# Patient Record
Sex: Female | Born: 1985
Health system: Southern US, Community
[De-identification: ages and names within clinical notes are randomized; demographics above are authoritative.]

## PROBLEM LIST (undated history)

## (undated) DIAGNOSIS — N189 Chronic kidney disease, unspecified: Secondary | ICD-10-CM

## (undated) HISTORY — PX: NO PAST SURGERIES: SHX2092

## (undated) HISTORY — PX: DENTAL SURGERY: SHX609

## (undated) HISTORY — DX: Chronic kidney disease, unspecified: N18.9

---
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
## 2009-07-11 ENCOUNTER — Ambulatory Visit: Payer: Self-pay | Admitting: Diagnostic Radiology

## 2009-07-11 ENCOUNTER — Emergency Department (HOSPITAL_BASED_OUTPATIENT_CLINIC_OR_DEPARTMENT_OTHER): Admission: EM | Admit: 2009-07-11 | Discharge: 2009-07-11 | Payer: Self-pay | Admitting: Emergency Medicine

## 2010-03-26 IMAGING — CT CT ABDOMEN W/O CM
2 of 4 series · 16 of 46 positions shown, 18 images · non-contrast
Comparison: None available.

CT ABDOMEN

CLINICAL DATA: Left flank pain.

CT ABDOMEN AND PELVIS WITHOUT CONTRAST
TECHNIQUE: Multidetector CT imaging of the abdomen and pelvis was
performed following the standard protocol without intravenous
contrast.

[Series 2: renal stone < 200 lbs 5.0 b31f · axial · 0.65mm/px · z∈[-680,-305]mm · 13 of 83 slices shown, 15 images]
[im 4/83  soft-tissue]
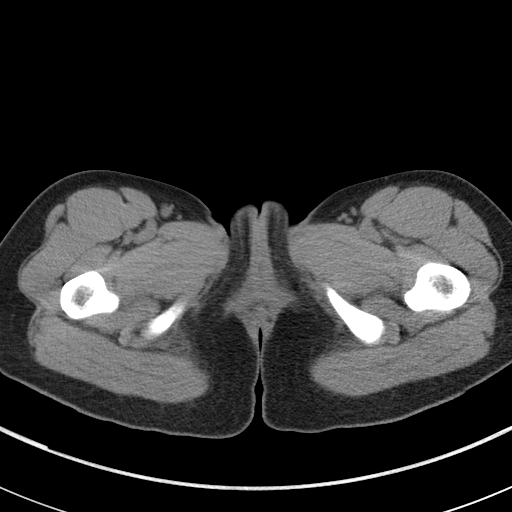
[im 4/83  bone]
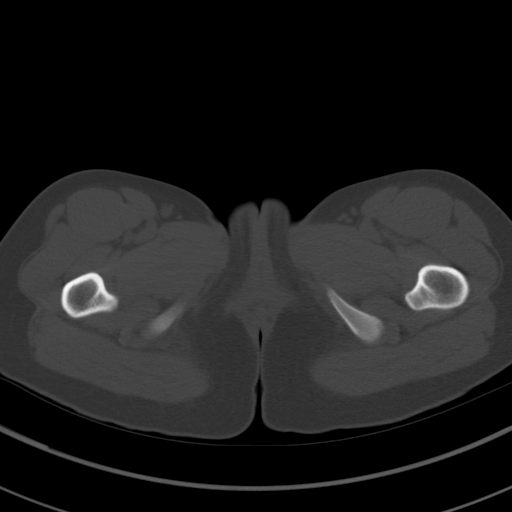
[im 10/83  soft-tissue]
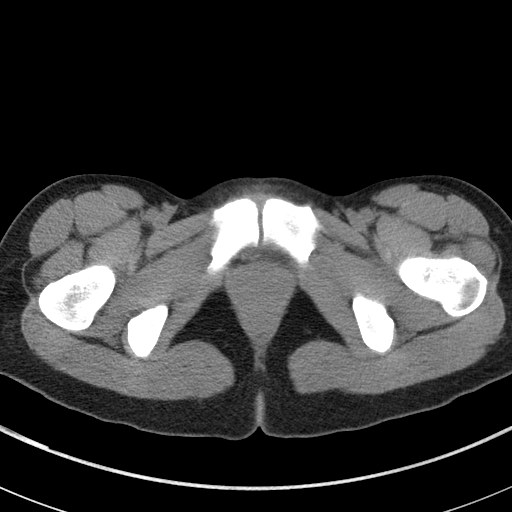
[im 17/83  soft-tissue]
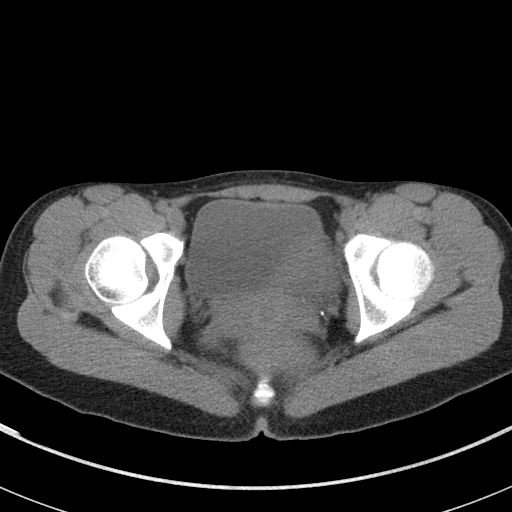
[im 23/83  soft-tissue]
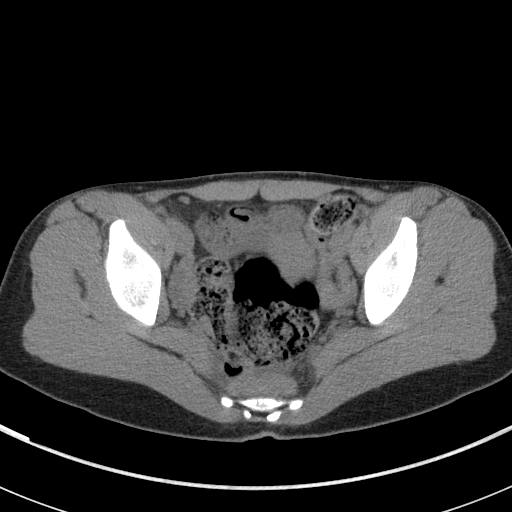
[im 30/83  soft-tissue]
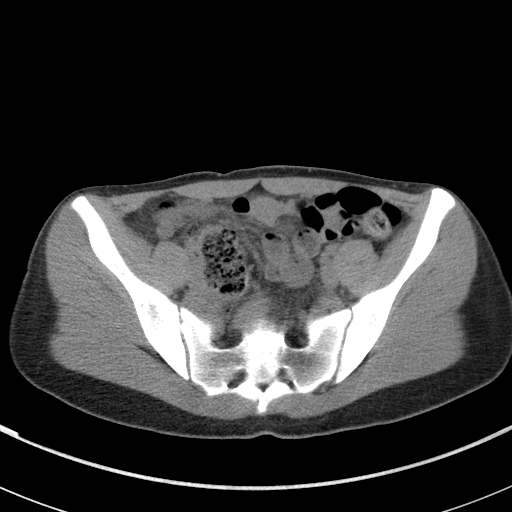
[im 37/83  soft-tissue]
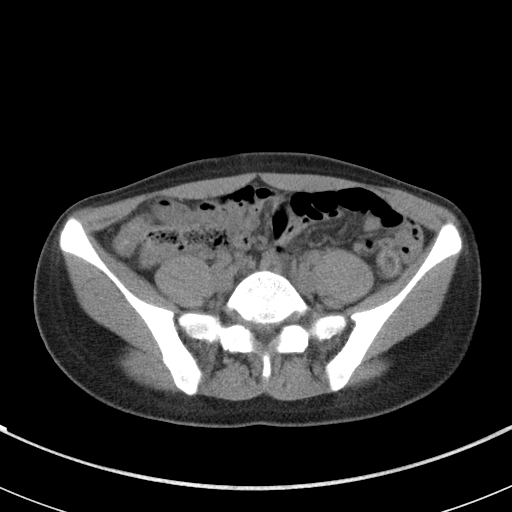
[im 43/83  soft-tissue]
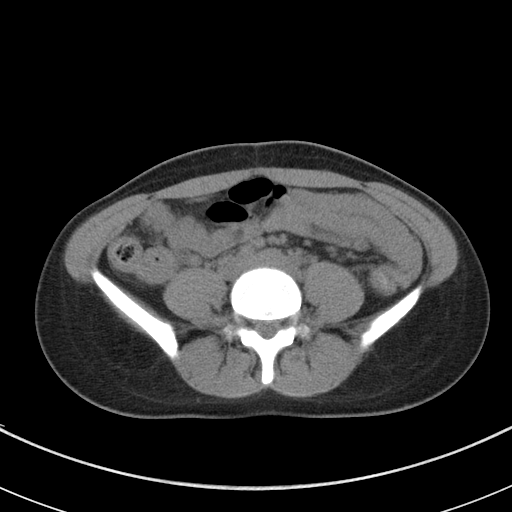
[im 46/83  soft-tissue]
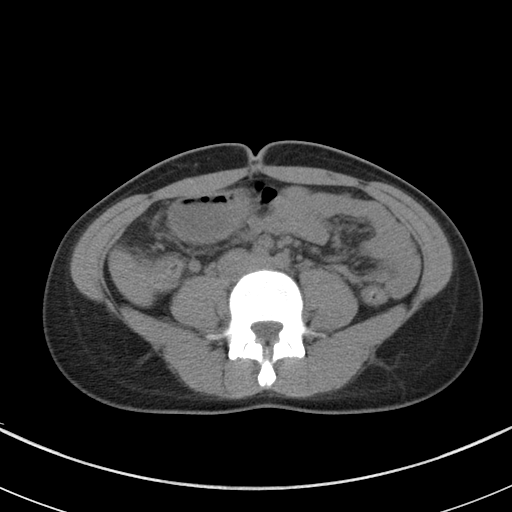
[im 53/83  soft-tissue]
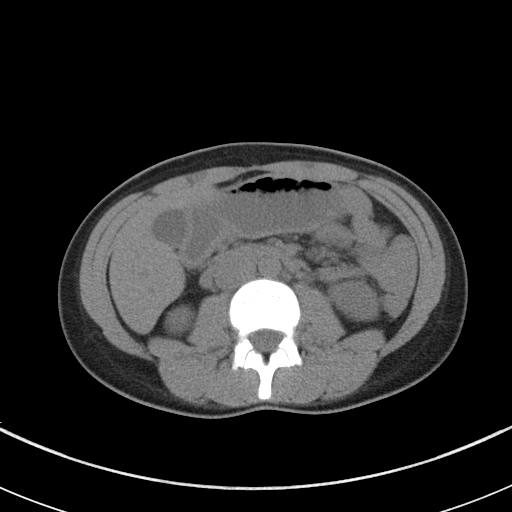
[im 53/83  bone]
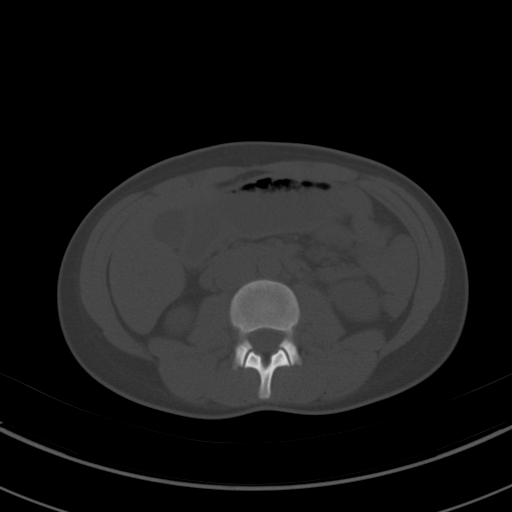
[im 60/83  soft-tissue]
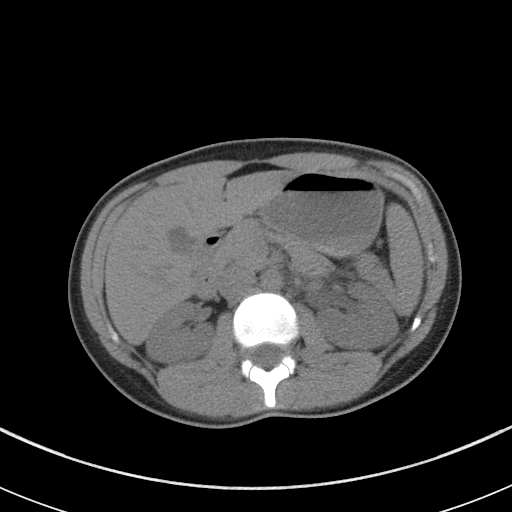
[im 66/83  soft-tissue]
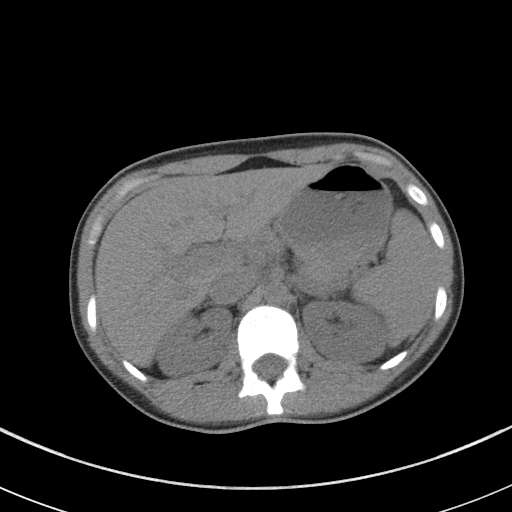
[im 73/83  soft-tissue]
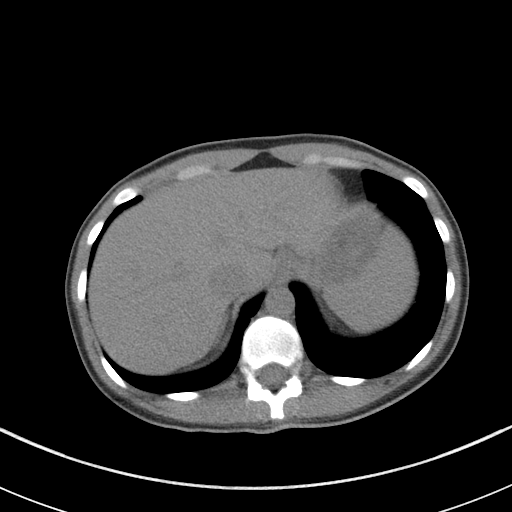
[im 79/83  soft-tissue]
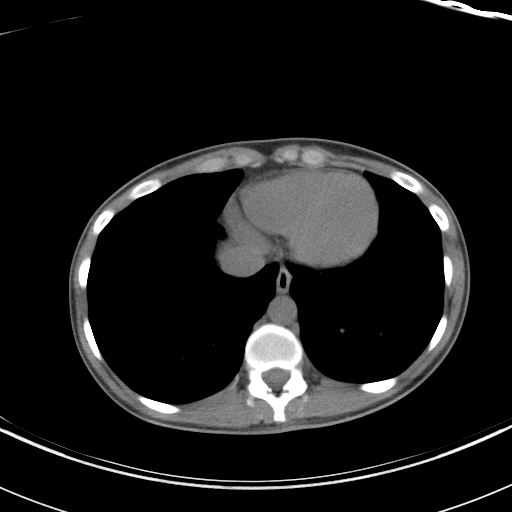

[Series 5: renal stone 3.0 coronal · coronal · 0.65mm/px · 3 of 63 slices shown]
[im 21/63  soft-tissue]
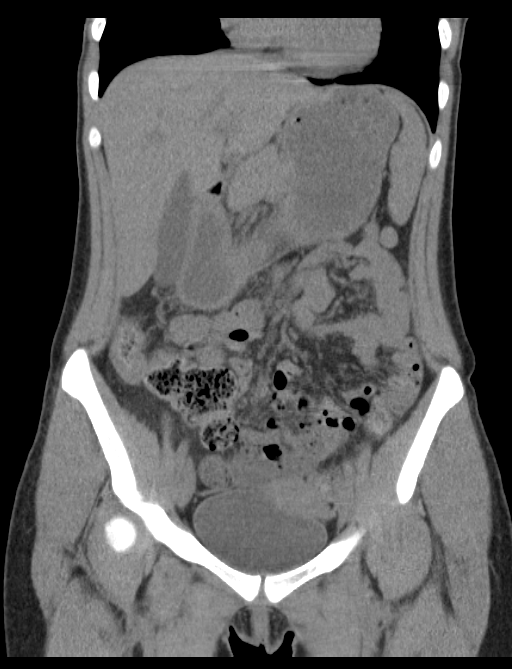
[im 28/63  soft-tissue]
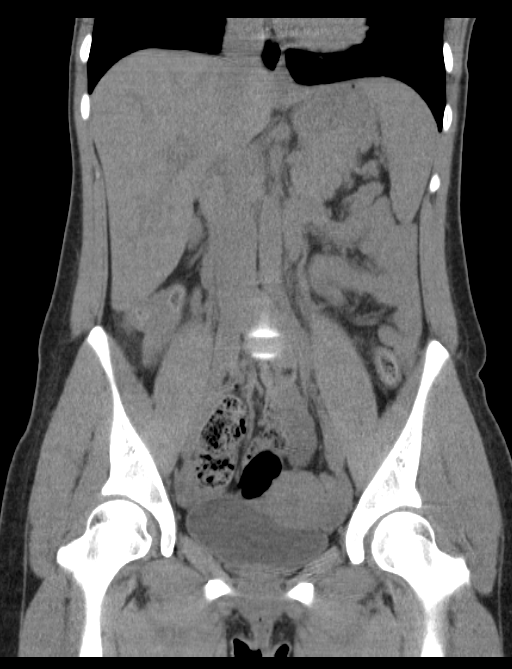
[im 35/63  soft-tissue]
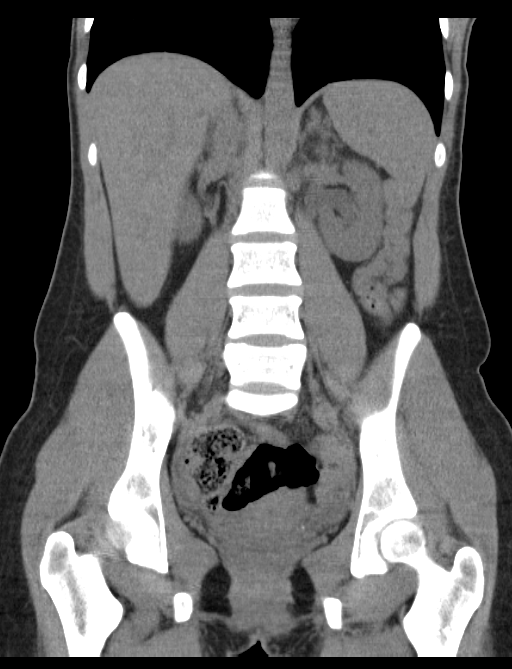

[16 of 46 positions shown; findings below may reference images not displayed]

FINDINGS: The lung bases are clear.  There is no pleural or
pericardial effusion.

The patient has mild to moderate left hydronephrosis.  The ureter
cannot be traced in its entirety due to lack of intra-abdominal
fat.  No definite left ureteral stone is identified although two
punctate calcifications in left hemi pelvis measuring 2 mm or less
could lie within the left ureter.  There are no renal stones on the
right or left.  The liver, gallbladder, spleen, pancreas and
adrenal glands appear normal.  Stomach small bowel normal.  No
abdominal lymphadenopathy or fluid.  No focal bony abnormality.
IMPRESSION: Mild to moderate left hydronephrosis.  As described above, the left
ureter cannot be traced in its entirety.  Two punctate
calcifications in the left hemi pelvis measuring 2 mm or less may
lie within the left ureter can be phleboliths.  It is also possible
that the patient has already passed a stone  and has residual left
hydronephrosis.  Abdomen is otherwise negative.

CT PELVIS
FINDINGS: Uterus, adnexa and urinary bladder appear normal.  Tiny
amount of free pelvic fluid is compatible physiologic change.
Colon and appendix appear normal.  No focal bony abnormality.
IMPRESSION: Possible distal left ureteral stone is again noted.  Pelvis is
otherwise negative.

## 2010-12-18 LAB — DIFFERENTIAL
Basophils Absolute: 0.1 10*3/uL (ref 0.0–0.1)
Basophils Relative: 0 % (ref 0–1)
Eosinophils Absolute: 0 10*3/uL (ref 0.0–0.7)
Monocytes Absolute: 0.6 10*3/uL (ref 0.1–1.0)
Neutro Abs: 11.4 10*3/uL — ABNORMAL HIGH (ref 1.7–7.7)
Neutrophils Relative %: 85 % — ABNORMAL HIGH (ref 43–77)

## 2010-12-18 LAB — CBC
HCT: 40.7 % (ref 36.0–46.0)
Hemoglobin: 14.2 g/dL (ref 12.0–15.0)
MCHC: 34.9 g/dL (ref 30.0–36.0)
MCV: 84.9 fL (ref 78.0–100.0)
Platelets: 190 10*3/uL (ref 150–400)
RBC: 4.8 MIL/uL (ref 3.87–5.11)
RDW: 12.3 % (ref 11.5–15.5)
WBC: 13.6 10*3/uL — ABNORMAL HIGH (ref 4.0–10.5)

## 2010-12-18 LAB — URINALYSIS, ROUTINE W REFLEX MICROSCOPIC
Ketones, ur: 15 mg/dL — AB
Protein, ur: NEGATIVE mg/dL
Urobilinogen, UA: 0.2 mg/dL (ref 0.0–1.0)

## 2010-12-18 LAB — COMPREHENSIVE METABOLIC PANEL
ALT: 12 U/L (ref 0–35)
AST: 25 U/L (ref 0–37)
Albumin: 4.6 g/dL (ref 3.5–5.2)
Alkaline Phosphatase: 63 U/L (ref 39–117)
BUN: 15 mg/dL (ref 6–23)
CO2: 26 mEq/L (ref 19–32)
Calcium: 9.8 mg/dL (ref 8.4–10.5)
Chloride: 104 mEq/L (ref 96–112)
Creatinine, Ser: 0.9 mg/dL (ref 0.4–1.2)
GFR calc Af Amer: >60 mL/min (ref >60)
GFR calc non Af Amer: >60 mL/min (ref >60)
Glucose, Bld: 144 mg/dL — ABNORMAL HIGH (ref 70–99)
Potassium: 4 mEq/L (ref 3.5–5.1)
Sodium: 142 mEq/L (ref 135–145)
Total Bilirubin: 0.8 mg/dL (ref 0.3–1.2)
Total Protein: 7.6 g/dL (ref 6.0–8.3)

## 2010-12-18 LAB — URINE MICROSCOPIC-ADD ON

## 2016-04-20 ENCOUNTER — Ambulatory Visit (INDEPENDENT_AMBULATORY_CARE_PROVIDER_SITE_OTHER): Payer: BLUE CROSS/BLUE SHIELD | Admitting: Certified Nurse Midwife

## 2016-04-20 ENCOUNTER — Encounter: Payer: Self-pay | Admitting: Certified Nurse Midwife

## 2016-04-20 ENCOUNTER — Encounter (INDEPENDENT_AMBULATORY_CARE_PROVIDER_SITE_OTHER): Payer: Self-pay

## 2016-04-20 VITALS — BP 129/70 | HR 69 | Ht 66.0 in | Wt 127.0 lb

## 2016-04-20 DIAGNOSIS — Z3402 Encounter for supervision of normal first pregnancy, second trimester: Secondary | ICD-10-CM

## 2016-04-20 DIAGNOSIS — Z36 Encounter for antenatal screening of mother: Secondary | ICD-10-CM | POA: Diagnosis not present

## 2016-04-20 DIAGNOSIS — Z113 Encounter for screening for infections with a predominantly sexual mode of transmission: Secondary | ICD-10-CM | POA: Diagnosis not present

## 2016-04-20 DIAGNOSIS — Z3492 Encounter for supervision of normal pregnancy, unspecified, second trimester: Secondary | ICD-10-CM

## 2016-04-20 DIAGNOSIS — Z3400 Encounter for supervision of normal first pregnancy, unspecified trimester: Secondary | ICD-10-CM | POA: Insufficient documentation

## 2016-04-20 NOTE — Progress Notes (Signed)
[name] PATIENT: [x ] initial, [x ] 12, [ ]  20, [ ]  28, [ ]  32, [ ]  36, [ ]  38, [ ]  39, [ ]  40   Bedside U/S today showas IUP with FHT of 162  GA measures 4080w6d

## 2016-04-20 NOTE — Progress Notes (Signed)
  Subjective:    Brittany Welch is a G1P0 3566w6d being seen today for her first obstetrical visit.  Her obstetrical history is significant for none. Patient does intend to breast feed. Pregnancy history fully reviewed.  Patient reports nausea and vomiting, occasional 1-3 times per week.  Vitals:   04/20/16 0817 04/20/16 0822  BP: 129/70   Pulse: 69   Weight: 127 lb (57.6 kg)   Height:  5\' 6"  (1.676 m)    HISTORY: OB History  Gravida Para Term Preterm AB Living  1            SAB TAB Ectopic Multiple Live Births               # Outcome Date GA Lbr Len/2nd Weight Sex Delivery Anes PTL Lv  1 Current              Past Medical History:  Diagnosis Date  . Chronic kidney disease    nephrolithiasis   Past Surgical History:  Procedure Laterality Date  . DENTAL SURGERY     History reviewed. No pertinent family history.   Exam    Uterus:     Pelvic Exam:    Perineum: deferred   Vulva:    Vagina:     pH:    Cervix:    Adnexa:    Bony Pelvis:   System: Breast:  normal appearance, no masses or tenderness, Inspection negative, No nipple retraction or dimpling, No nipple discharge or bleeding, No axillary or supraclavicular adenopathy, Normal to palpation without dominant masses, Taught monthly breast self examination   Skin: normal coloration and turgor, no rashes    Neurologic: oriented, normal   Extremities: normal strength, tone, and muscle mass, no deformities, ROM of all joints is normal   HEENT PERRLA   Mouth/Teeth mucous membranes moist, pharynx normal without lesions   Neck supple and no masses   Cardiovascular: regular rate and rhythm, no murmurs or gallops   Respiratory:  appears well, vitals normal, no respiratory distress, acyanotic, normal RR, ear and throat exam is normal, neck free of mass or lymphadenopathy, chest clear, no wheezing, crepitations, rhonchi, normal symmetric air entry   Abdomen: Soft, non-tender   Urinary: deferred     Assessment:    Pregnancy: G1P0 Patient Active Problem List   Diagnosis Date Noted  . Supervision of normal first pregnancy 04/20/2016     Plan:     Initial labs drawn. Prenatal vitamins. Problem list reviewed and updated. Ultrasound discussed; fetal survey: requested. Follow up in 6 weeks. Baby Scripts Declined pelvic and pap today, discussed recommendations for cervical cancer screening  Donette LarryMelanie Cameron Schwinn, CNM 04/20/2016

## 2016-04-21 LAB — OBSTETRIC PANEL
ANTIBODY SCREEN: NEGATIVE
BASOS ABS: 0 {cells}/uL (ref 0–200)
Basophils Relative: 0 %
EOS PCT: 0 %
Eosinophils Absolute: 0 cells/uL — ABNORMAL LOW (ref 15–500)
HEMATOCRIT: 37.3 % (ref 35.0–45.0)
HEMOGLOBIN: 12.5 g/dL (ref 11.7–15.5)
Hepatitis B Surface Ag: NEGATIVE
LYMPHS PCT: 19 %
Lymphs Abs: 1691 cells/uL (ref 850–3900)
MCH: 27.8 pg (ref 27.0–33.0)
MCHC: 33.5 g/dL (ref 32.0–36.0)
MCV: 82.9 fL (ref 80.0–100.0)
MONOS PCT: 5 %
MPV: 11 fL (ref 7.5–12.5)
Monocytes Absolute: 445 cells/uL (ref 200–950)
NEUTROS ABS: 6764 {cells}/uL (ref 1500–7800)
Neutrophils Relative %: 76 %
Platelets: 210 10*3/uL (ref 140–400)
RBC: 4.5 MIL/uL (ref 3.80–5.10)
RDW: 14.1 % (ref 11.0–15.0)
Rh Type: POSITIVE
Rubella: <0.9 Index (ref <0.90)
WBC: 8.9 10*3/uL (ref 3.8–10.8)

## 2016-04-21 LAB — GC/CHLAMYDIA PROBE AMP
CT PROBE, AMP APTIMA: NOT DETECTED
GC PROBE AMP APTIMA: NOT DETECTED

## 2016-04-27 ENCOUNTER — Encounter: Payer: Self-pay | Admitting: Certified Nurse Midwife

## 2016-05-19 ENCOUNTER — Encounter (HOSPITAL_COMMUNITY): Payer: Self-pay | Admitting: Certified Nurse Midwife

## 2016-05-27 ENCOUNTER — Ambulatory Visit (HOSPITAL_COMMUNITY)
Admission: RE | Admit: 2016-05-27 | Discharge: 2016-05-27 | Disposition: A | Discharge status: Home / Self Care | Payer: BLUE CROSS/BLUE SHIELD | Source: Ambulatory Visit | Attending: Certified Nurse Midwife | Admitting: Certified Nurse Midwife

## 2016-05-27 ENCOUNTER — Other Ambulatory Visit: Payer: Self-pay | Admitting: Certified Nurse Midwife

## 2016-05-27 DIAGNOSIS — Z1389 Encounter for screening for other disorder: Secondary | ICD-10-CM

## 2016-05-27 DIAGNOSIS — Z3402 Encounter for supervision of normal first pregnancy, second trimester: Secondary | ICD-10-CM

## 2016-05-27 DIAGNOSIS — Z3A19 19 weeks gestation of pregnancy: Secondary | ICD-10-CM | POA: Insufficient documentation

## 2016-05-27 DIAGNOSIS — Z3492 Encounter for supervision of normal pregnancy, unspecified, second trimester: Secondary | ICD-10-CM

## 2016-05-27 DIAGNOSIS — Z36 Encounter for antenatal screening of mother: Secondary | ICD-10-CM | POA: Insufficient documentation

## 2016-05-27 DIAGNOSIS — Z363 Encounter for antenatal screening for malformations: Secondary | ICD-10-CM

## 2016-06-01 ENCOUNTER — Ambulatory Visit (INDEPENDENT_AMBULATORY_CARE_PROVIDER_SITE_OTHER): Payer: BLUE CROSS/BLUE SHIELD | Admitting: Certified Nurse Midwife

## 2016-06-01 DIAGNOSIS — Z3402 Encounter for supervision of normal first pregnancy, second trimester: Secondary | ICD-10-CM

## 2016-06-01 NOTE — Progress Notes (Signed)
Subjective:  Brittany GatesKristen D Bethea is a 30 y.o. G1P0 at 282w6d being seen today for ongoing prenatal care.  She is currently monitored for the following issues for this low-risk pregnancy and has Supervision of normal first pregnancy on her problem list.  Patient reports no complaints.  Contractions: Not present. Vag. Bleeding: None.  Movement: Absent. Denies leaking of fluid.   The following portions of the patient's history were reviewed and updated as appropriate: allergies, current medications, past family history, past medical history, past social history, past surgical history and problem list. Problem list updated.  Objective:   Vitals:   06/01/16 0854  BP: 122/65  Pulse: 87  Weight: 134 lb (60.8 kg)    Fetal Status: Fetal Heart Rate (bpm): 147 Fundal Height: 20 cm Movement: Absent     General:  Alert, oriented and cooperative. Patient is in no acute distress.  Skin: Skin is warm and dry. No rash noted.   Cardiovascular: Normal heart rate noted  Respiratory: Normal respiratory effort, no problems with respiration noted  Abdomen: Soft, gravid, appropriate for gestational age. Pain/Pressure: Absent     Pelvic: Vag. Bleeding: None Vag D/C Character: Thin   Cervical exam deferred        Extremities: Normal range of motion.  Edema: None  Mental Status: Normal mood and affect. Normal behavior. Normal judgment and thought content.   Urinalysis: Urine Protein: Negative Urine Glucose: Negative  Assessment and Plan:  Pregnancy: G1P0 at 192w6d  1. Encounter for supervision of normal first pregnancy in second trimester - Second trimester anticipatory guidance  Preterm labor symptoms and general obstetric precautions including but not limited to vaginal bleeding, contractions, leaking of fluid and fetal movement were reviewed in detail with the patient. Please refer to After Visit Summary for other counseling recommendations.  Return in about 8 weeks (around 07/27/2016).   Donette LarryMelanie  Journe Hallmark, CNM

## 2016-06-02 LAB — HIV ANTIBODY (ROUTINE TESTING W REFLEX): HIV 1&2 Ab, 4th Generation: NONREACTIVE

## 2016-07-27 ENCOUNTER — Encounter: Payer: BLUE CROSS/BLUE SHIELD | Admitting: Obstetrics & Gynecology

## 2016-07-28 ENCOUNTER — Ambulatory Visit (INDEPENDENT_AMBULATORY_CARE_PROVIDER_SITE_OTHER): Payer: BLUE CROSS/BLUE SHIELD | Admitting: Obstetrics & Gynecology

## 2016-07-28 VITALS — BP 120/63 | HR 108 | Wt 143.0 lb

## 2016-07-28 DIAGNOSIS — Z3492 Encounter for supervision of normal pregnancy, unspecified, second trimester: Secondary | ICD-10-CM | POA: Diagnosis not present

## 2016-07-28 DIAGNOSIS — Z3402 Encounter for supervision of normal first pregnancy, second trimester: Secondary | ICD-10-CM

## 2016-07-28 NOTE — Progress Notes (Signed)
   PRENATAL VISIT NOTE  Subjective:  Brittany GatesKristen D Welch is a 30 y.o. G1P0 at 3852w0d being seen today for ongoing prenatal care.  She is currently monitored for the following issues for this low-risk pregnancy and has Supervision of normal first pregnancy on her problem list.  Patient reports no complaints.  Contractions: Not present. Vag. Bleeding: None.  Movement: Present. Denies leaking of fluid.   The following portions of the patient's history were reviewed and updated as appropriate: allergies, current medications, past family history, past medical history, past social history, past surgical history and problem list. Problem list updated.  Objective:   Vitals:   07/28/16 1518  BP: 120/63  Pulse: (!) 108  Weight: 143 lb (64.9 kg)    Fetal Status: Fetal Heart Rate (bpm): 143   Movement: Present     General:  Alert, oriented and cooperative. Patient is in no acute distress.  Skin: Skin is warm and dry. No rash noted.   Cardiovascular: Normal heart rate noted  Respiratory: Normal respiratory effort, no problems with respiration noted  Abdomen: Soft, gravid, appropriate for gestational age. Pain/Pressure: Absent     Pelvic:  Cervical exam deferred        Extremities: Normal range of motion.  Edema: None  Mental Status: Normal mood and affect. Normal behavior. Normal judgment and thought content.   Assessment and Plan:  Pregnancy: G1P0 at 6152w0d  1. Normal pregnancy in second trimester  - Glucose Tolerance, 1 HR (50g) - CBC - HIV antibody (with reflex) - RPR  2. Encounter for supervision of normal first pregnancy in second trimester   Preterm labor symptoms and general obstetric precautions including but not limited to vaginal bleeding, contractions, leaking of fluid and fetal movement were reviewed in detail with the patient. Please refer to After Visit Summary for other counseling recommendations.  No Follow-up on file.   Allie BossierMyra C Lanny Donoso, MD

## 2016-07-29 ENCOUNTER — Telehealth: Payer: Self-pay

## 2016-07-29 LAB — CBC
HCT: 32.6 % — ABNORMAL LOW (ref 35.0–45.0)
Hemoglobin: 11 g/dL — ABNORMAL LOW (ref 11.7–15.5)
MCH: 28.9 pg (ref 27.0–33.0)
MCHC: 33.7 g/dL (ref 32.0–36.0)
MCV: 85.6 fL (ref 80.0–100.0)
MPV: 11.6 fL (ref 7.5–12.5)
PLATELETS: 202 10*3/uL (ref 140–400)
RBC: 3.81 MIL/uL (ref 3.80–5.10)
RDW: 14.3 % (ref 11.0–15.0)
WBC: 9.8 10*3/uL (ref 3.8–10.8)

## 2016-07-29 LAB — RPR

## 2016-07-29 LAB — GLUCOSE TOLERANCE, 1 HOUR (50G) W/O FASTING: Glucose, 1 Hr, gestational: 121 mg/dL (ref <140)

## 2016-07-29 LAB — HIV ANTIBODY (ROUTINE TESTING W REFLEX): HIV: NONREACTIVE

## 2016-07-29 NOTE — Telephone Encounter (Signed)
Spoke with pt she is aware that she passed GTT

## 2016-08-25 ENCOUNTER — Ambulatory Visit (INDEPENDENT_AMBULATORY_CARE_PROVIDER_SITE_OTHER): Payer: BLUE CROSS/BLUE SHIELD | Admitting: Obstetrics & Gynecology

## 2016-08-25 VITALS — BP 110/54 | HR 96 | Wt 147.0 lb

## 2016-08-25 DIAGNOSIS — Z3402 Encounter for supervision of normal first pregnancy, second trimester: Secondary | ICD-10-CM

## 2016-08-25 NOTE — Progress Notes (Signed)
   PRENATAL VISIT NOTE  Subjective:  Brittany Welch is a 30 y.o. MW G1P0 (son to be named Lucillie Garfinkelarter Matthew)  at 2539w0d being seen today for ongoing prenatal care.  She is currently monitored for the following issues for this low-risk pregnancy and has Supervision of normal first pregnancy on her problem list.  Patient reports no complaints.  Contractions: Not present. Vag. Bleeding: None.  Movement: Present. Denies leaking of fluid.   The following portions of the patient's history were reviewed and updated as appropriate: allergies, current medications, past family history, past medical history, past social history, past surgical history and problem list. Problem list updated.  Objective:   Vitals:   08/25/16 1451  BP: (!) 110/54  Pulse: 96  Weight: 147 lb (66.7 kg)    Fetal Status: Fetal Heart Rate (bpm): 152   Movement: Present     General:  Alert, oriented and cooperative. Patient is in no acute distress.  Skin: Skin is warm and dry. No rash noted.   Cardiovascular: Normal heart rate noted  Respiratory: Normal respiratory effort, no problems with respiration noted  Abdomen: Soft, gravid, appropriate for gestational age. Pain/Pressure: Absent     Pelvic:  Cervical exam deferred        Extremities: Normal range of motion.  Edema: Trace  Mental Status: Normal mood and affect. Normal behavior. Normal judgment and thought content.   Assessment and Plan:  Pregnancy: G1P0 at 4939w0d  1. Encounter for supervision of normal first pregnancy in second trimester - Cervical cultures at next visit  Preterm labor symptoms and general obstetric precautions including but not limited to vaginal bleeding, contractions, leaking of fluid and fetal movement were reviewed in detail with the patient. Please refer to After Visit Summary for other counseling recommendations.  No Follow-up on file.   Allie BossierMyra C Junia Nygren, MD

## 2016-09-14 NOTE — L&D Delivery Note (Addendum)
31 y.o. G1P1001 at 5397w1d delivered a viable female infant in cephalic, LOA position. No nuchal cord. Right anterior shoulder delivered with ease. 90 sec delayed cord clamping. Cord clamped x2 and cut. Placenta delivered spontaneously intact, with 3VC. Fundus firm on exam with massage and pitocin. Good hemostasis noted.  Anesthesia: Epidural; local anesthesia for repair Laceration: Deep second degree perineal laceration with bil Suture: 3-0 Vicryl; 3-0 Monocryl Good hemostasis noted. EBL: 500cc  Mom and baby recovering in LDR.    Apgars: APGAR (1 MIN): 7   APGAR (5 MINS): 9     Weight: 9lbs 9oz; 4345g  Patient received the full dose of Ampicillin before delivery, and started gentamicin before delivery for Triple I which she developed at the latter part of her second stage of delivery.   Jen MowElizabeth Trayson Stitely, DO [facility] Fellow [facility] 10/28/2016, 1:55 PM

## 2016-09-24 ENCOUNTER — Ambulatory Visit (INDEPENDENT_AMBULATORY_CARE_PROVIDER_SITE_OTHER): Payer: BLUE CROSS/BLUE SHIELD | Admitting: Obstetrics & Gynecology

## 2016-09-24 VITALS — BP 128/78 | HR 99 | Wt 153.0 lb

## 2016-09-24 DIAGNOSIS — Z113 Encounter for screening for infections with a predominantly sexual mode of transmission: Secondary | ICD-10-CM | POA: Diagnosis not present

## 2016-09-24 DIAGNOSIS — Z3493 Encounter for supervision of normal pregnancy, unspecified, third trimester: Secondary | ICD-10-CM | POA: Diagnosis not present

## 2016-09-24 LAB — OB RESULTS CONSOLE GC/CHLAMYDIA: GC PROBE AMP, GENITAL: NEGATIVE

## 2016-09-24 LAB — OB RESULTS CONSOLE GBS: STREP GROUP B AG: NEGATIVE

## 2016-09-24 NOTE — Progress Notes (Signed)
   PRENATAL VISIT NOTE  Subjective:  Brittany Welch is a 31 y.o. G1P0 at 5589w2d being seen today for ongoing prenatal care.  She is currently monitored for the following issues for this low-risk pregnancy and has Supervision of normal first pregnancy on her problem list.  Patient reports no complaints.  Contractions: Not present. Vag. Bleeding: None.  Movement: Present. Denies leaking of fluid.   The following portions of the patient's history were reviewed and updated as appropriate: allergies, current medications, past family history, past medical history, past social history, past surgical history and problem list. Problem list updated.  Objective:   Vitals:   09/24/16 1552  BP: 128/78  Pulse: 99  Weight: 153 lb (69.4 kg)    Fetal Status: Fetal Heart Rate (bpm): 141   Movement: Present     General:  Alert, oriented and cooperative. Patient is in no acute distress.  Skin: Skin is warm and dry. No rash noted.   Cardiovascular: Normal heart rate noted  Respiratory: Normal respiratory effort, no problems with respiration noted  Abdomen: Soft, gravid, appropriate for gestational age. Pain/Pressure: Present     Pelvic:  Cervical exam deferred        Extremities: Normal range of motion.  Edema: Mild pitting, slight indentation  Mental Status: Normal mood and affect. Normal behavior. Normal judgment and thought content.   Assessment and Plan:  Pregnancy: G1P0 at 5089w2d  1. Normal pregnancy in third trimester  - Culture, beta strep (group b only) - Urine cytology ancillary only  Preterm labor symptoms and general obstetric precautions including but not limited to vaginal bleeding, contractions, leaking of fluid and fetal movement were reviewed in detail with the patient. Please refer to After Visit Summary for other counseling recommendations.  No Follow-up on file.   Allie BossierMyra C Diedra Sinor, MD

## 2016-09-26 LAB — CULTURE, BETA STREP (GROUP B ONLY)

## 2016-09-28 LAB — URINE CYTOLOGY ANCILLARY ONLY
Chlamydia: NEGATIVE
NEISSERIA GONORRHEA: NEGATIVE

## 2016-10-07 ENCOUNTER — Ambulatory Visit (INDEPENDENT_AMBULATORY_CARE_PROVIDER_SITE_OTHER): Payer: BLUE CROSS/BLUE SHIELD | Admitting: Obstetrics & Gynecology

## 2016-10-07 VITALS — BP 117/77 | HR 83 | Wt 151.0 lb

## 2016-10-07 DIAGNOSIS — Z3403 Encounter for supervision of normal first pregnancy, third trimester: Secondary | ICD-10-CM

## 2016-10-07 NOTE — Progress Notes (Signed)
   PRENATAL VISIT NOTE  Subjective:  Brittany Welch is a 31 y.o. MW G1P0 at 1128w1d being seen today for ongoing prenatal care.  She is currently monitored for the following issues for this low-risk pregnancy and has Supervision of normal first pregnancy on her problem list.  Patient reports no complaints.  Contractions: Not present. Vag. Bleeding: None.  Movement: Present. Denies leaking of fluid.   The following portions of the patient's history were reviewed and updated as appropriate: allergies, current medications, past family history, past medical history, past social history, past surgical history and problem list. Problem list updated.  Objective:   Vitals:   10/07/16 0956  BP: 117/77  Pulse: 83  Weight: 151 lb (68.5 kg)    Fetal Status: Fetal Heart Rate (bpm): 143   Movement: Present     General:  Alert, oriented and cooperative. Patient is in no acute distress.  Skin: Skin is warm and dry. No rash noted.   Cardiovascular: Normal heart rate noted  Respiratory: Normal respiratory effort, no problems with respiration noted  Abdomen: Soft, gravid, appropriate for gestational age. Pain/Pressure: Present     Pelvic:  Cervical exam deferred        Extremities: Normal range of motion.  Edema: Trace  Mental Status: Normal mood and affect. Normal behavior. Normal judgment and thought content.   Assessment and Plan:  Pregnancy: G1P0 at 1128w1d  1. Encounter for supervision of normal first pregnancy in third trimester   Term labor symptoms and general obstetric precautions including but not limited to vaginal bleeding, contractions, leaking of fluid and fetal movement were reviewed in detail with the patient. Please refer to After Visit Summary for other counseling recommendations.  No Follow-up on file.   Allie BossierMyra C Blayde Bacigalupi, MD

## 2016-10-16 ENCOUNTER — Ambulatory Visit (INDEPENDENT_AMBULATORY_CARE_PROVIDER_SITE_OTHER): Payer: BLUE CROSS/BLUE SHIELD | Admitting: Family

## 2016-10-16 VITALS — BP 131/83 | HR 77 | Wt 152.0 lb

## 2016-10-16 DIAGNOSIS — Z2839 Other underimmunization status: Secondary | ICD-10-CM

## 2016-10-16 DIAGNOSIS — O09899 Supervision of other high risk pregnancies, unspecified trimester: Secondary | ICD-10-CM

## 2016-10-16 DIAGNOSIS — Z283 Underimmunization status: Secondary | ICD-10-CM

## 2016-10-16 DIAGNOSIS — Z3403 Encounter for supervision of normal first pregnancy, third trimester: Secondary | ICD-10-CM

## 2016-10-16 DIAGNOSIS — O9989 Other specified diseases and conditions complicating pregnancy, childbirth and the puerperium: Secondary | ICD-10-CM

## 2016-10-16 NOTE — Progress Notes (Signed)
   PRENATAL VISIT NOTE  Subjective:  Brittany Welch is a 31 y.o. G1P0 at 40w3dbeing seen today for ongoing prenatal care.  She is currently monitored for the following issues for this low-risk pregnancy and has Supervision of normal first pregnancy and Rubella non-immune status, antepartum on her problem list.  Patient reports no complaints.  Contractions: Not present. Vag. Bleeding: None.  Movement: Present. Denies leaking of fluid.   The following portions of the patient's history were reviewed and updated as appropriate: allergies, current medications, past family history, past medical history, past social history, past surgical history and problem list. Problem list updated.  Objective:   Vitals:   10/16/16 0901  BP: 131/83  Pulse: 77  Weight: 152 lb (68.9 kg)    Fetal Status: Fetal Heart Rate (bpm): 140 Fundal Height: 39 cm Movement: Present  Presentation: Vertex  General:  Alert, oriented and cooperative. Patient is in no acute distress.  Skin: Skin is warm and dry. No rash noted.   Cardiovascular: Normal heart rate noted  Respiratory: Normal respiratory effort, no problems with respiration noted  Abdomen: Soft, gravid, appropriate for gestational age. Pain/Pressure: Present     Pelvic:  Cervical exam deferred        Extremities: Normal range of motion.  Edema: Trace  Mental Status: Normal mood and affect. Normal behavior. Normal judgment and thought content.   Assessment and Plan:  Pregnancy: G1P0 at 337w3d1. Rubella non-immune status, antepartum - Offer MMR postpartum  2. Encounter for supervision of normal first pregnancy in third trimester - Reviewed signs of labor - Discussed sleep challenges after having a baby - NST at next appt  Term labor symptoms and general obstetric precautions including but not limited to vaginal bleeding, contractions, leaking of fluid and fetal movement were reviewed in detail with the patient. Please refer to After Visit Summary for  other counseling recommendations.  Return in about 1 week (around 10/23/2016) for appt and NST.   WaVenia Carbon Michiel CowboyCNM

## 2016-10-23 ENCOUNTER — Ambulatory Visit (INDEPENDENT_AMBULATORY_CARE_PROVIDER_SITE_OTHER): Payer: BLUE CROSS/BLUE SHIELD | Admitting: Advanced Practice Midwife

## 2016-10-23 VITALS — BP 112/52 | Wt 154.0 lb

## 2016-10-23 DIAGNOSIS — Z3403 Encounter for supervision of normal first pregnancy, third trimester: Secondary | ICD-10-CM

## 2016-10-23 DIAGNOSIS — Z283 Underimmunization status: Secondary | ICD-10-CM

## 2016-10-23 DIAGNOSIS — O09899 Supervision of other high risk pregnancies, unspecified trimester: Secondary | ICD-10-CM

## 2016-10-23 DIAGNOSIS — Z2839 Other underimmunization status: Secondary | ICD-10-CM

## 2016-10-23 DIAGNOSIS — O9989 Other specified diseases and conditions complicating pregnancy, childbirth and the puerperium: Principal | ICD-10-CM

## 2016-10-23 NOTE — Progress Notes (Signed)
   PRENATAL VISIT NOTE  Subjective:  Brittany GatesKristen D Welch is a 31 y.o. G1P0 at 5228w3d being seen today for ongoing prenatal care.  She is currently monitored for the following issues for this low-risk pregnancy and has Supervision of normal first pregnancy and Rubella non-immune status, antepartum on her problem list.  Patient reports occasional contractions.  Contractions: Not present. Vag. Bleeding: None.  Movement: Present. Denies leaking of fluid.   The following portions of the patient's history were reviewed and updated as appropriate: allergies, current medications, past family history, past medical history, past social history, past surgical history and problem list. Problem list updated.  Objective:   Vitals:   10/23/16 0915  BP: (!) 112/52  Weight: 154 lb (69.9 kg)    Fetal Status: Fetal Heart Rate (bpm): NST reactive Fundal Height: 41 cm Movement: Present  Presentation: Vertex  General:  Alert, oriented and cooperative. Patient is in no acute distress.  Skin: Skin is warm and dry. No rash noted.   Cardiovascular: Normal heart rate noted  Respiratory: Normal respiratory effort, no problems with respiration noted  Abdomen: Soft, gravid, appropriate for gestational age. Pain/Pressure: Present     Pelvic:  Cervical exam deferred        Extremities: Normal range of motion.  Edema: Trace  Mental Status: Normal mood and affect. Normal behavior. Normal judgment and thought content.   Assessment and Plan:  Pregnancy: G1P0 at 4228w3d  1. Encounter for supervision of normal first pregnancy in third trimester   2. Rubella non-immune status, antepartum   Term labor symptoms and general obstetric precautions including but not limited to vaginal bleeding, contractions, leaking of fluid and fetal movement were reviewed in detail with the patient. Please refer to After Visit Summary for other counseling recommendations.  IOL scheduled at 41 weeks 10/27/16 Return in about 6 weeks (around  12/04/2016) for Postpartum visit.   Dorathy KinsmanVirginia Evalin Shawhan, CNM

## 2016-10-23 NOTE — Patient Instructions (Signed)
Breastfeeding Challenges and Solutions Even though breastfeeding is natural, it can be challenging, especially in the first few weeks after childbirth. It is normal for problems to arise when starting to breastfeed your new baby, even if you have breastfed before. This document provides some solutions to the most common breastfeeding challenges. Challenges and solutions Challenge-Cracked or Sore Nipples  Cracked or sore nipples are commonly experienced by breastfeeding mothers. Cracked or sore nipples often are caused by inadequate latching (when your baby's mouth attaches to your breast to breastfeed). Soreness can also happen if your baby is not positioned properly at your breast. Although nipple cracking and soreness are common during the first week after birth, nipple pain is never normal. If you experience nipple cracking or soreness that lasts longer than 1 week or nipple pain, call your health care provider or lactation consultant. Solution  Ensure proper latching and positioning of your baby by following the steps below:  Find a comfortable place to sit or lie down, with your neck and back well supported.  Place a pillow or rolled up blanket under your baby to bring him or her to the level of your breast (if you are seated).  Make sure that your baby's abdomen is facing your abdomen.  Gently massage your breast. With your fingertips, massage from your chest wall toward your nipple in a circular motion. This encourages milk flow. You may need to continue this action during the feeding if your milk flows slowly.  Support your breast with 4 fingers underneath and your thumb above your nipple. Make sure your fingers are well away from your nipple and your baby's mouth.  Stroke your baby's lips gently with your finger or nipple.  When your baby's mouth is open wide enough, quickly bring your baby to your breast, placing your entire nipple and as much of the colored area around your nipple  (areola) as possible into your baby's mouth.  More areola should be visible above your baby's upper lip than below the lower lip.  Your baby's tongue should be between his or her lower gum and your breast.  Ensure that your baby's mouth is correctly positioned around your nipple (latched). Your baby's lips should create a seal on your breast and be turned out (everted).  It is common for your baby to suck for about 2-3 minutes in order to start the flow of breast milk. Signs that your baby has successfully latched on to your nipple include:  Quietly tugging or quietly sucking without causing you pain.  Swallowing heard between every 3-4 sucks.  Muscle movement above and in front of his or her ears with sucking. Signs that your baby has not successfully latched on to nipple include:  Sucking sounds or smacking sounds from your baby while nursing.  Nipple pain. Ensure that your breasts stay moisturized and healthy by:  Avoiding the use of soap on your nipples.  Wearing a supportive bra. Avoid wearing underwire-style bras or tight bras.  Air drying your nipples for 3-4 minutes after each feeding.  Using only cotton bra pads to absorb breast milk leakage. Leaking of breast milk between feedings is normal. Be sure to change the pads if they become soaked with milk.  Using lanolin on your nipples after nursing. Lanolin helps to maintain your skin's normal moisture barrier. If you use pure lanolin you do not need to wash it off before feeding your baby again. Pure lanolin is not toxic to your baby. You may also hand  express a few drops of breast milk and gently massage that milk into your nipples, allowing it to air dry. Challenge-Breast Engorgement  Breast engorgement is the overfilling of your breasts with breast milk. In the first few weeks after giving birth, you may experience breast engorgement. Breast engorgement can make your breasts throb and feel hard, tightly stretched, warm, and  tender. Engorgement peaks about the fifth day after you give birth. Having breast engorgement does not mean you have to stop breastfeeding your baby. Solution  Breastfeed when you feel the need to reduce the fullness of your breasts or when your baby shows signs of hunger. This is called "breastfeeding on demand."  Newborns (babies younger than 4 weeks) often breastfeed every 1-3 hours during the day. You may need to awaken your baby to feed if he or she is asleep at a feeding time.  Do not allow your baby to sleep longer than 5 hours during the night without a feeding.  Pump or hand express breast milk before breastfeeding to soften your breast, areola, and nipple.  Apply warm, moist heat (in the shower or with warm water-soaked hand towels) just before feeding or pumping, or massage your breast before or during breastfeeding. This increases circulation and helps your milk to flow.  Completely empty your breasts when breastfeeding or pumping. Afterward, wear a snug bra (nursing or regular) or tank top for 1-2 days to signal your body to slightly decrease milk production. Only wear snug bras or tank tops to treat engorgement. Tight bras typically should be avoided by breastfeeding mothers. Once engorgement is relieved, return to wearing regular, loose-fitting clothes.  Apply ice packs to your breasts to lessen the pain from engorgement and relieve swelling, unless the ice is uncomfortable for you.  Do not delay feedings. Try to relax when it is time to feed your baby. This helps to trigger your "let-down reflex," which releases milk from your breast.  Ensure your baby is latched on to your breast and positioned properly while breastfeeding.  Allow your baby to remain at your breast as long as he or she is latched on well and actively sucking. Your baby will let you know when he or she is done breastfeeding by pulling away from your breast or falling asleep.  Avoid introducing bottles or  pacifiers to your baby in the early weeks of breastfeeding. Wait to introduce these things until after resolving any breastfeeding challenges.  Try to pump your milk on the same schedule as when your baby would breastfeed if you are returning to work or away from home for an extended period.  Drink plenty of fluids to avoid dehydration, which can eventually put you at greater risk of breast engorgement. If you follow these suggestions, your engorgement should improve in 24-48 hours. If you are still experiencing difficulty, call your lactation consultant or health care provider. Challenge-Plugged Milk Ducts  Plugged milk ducts occur when the duct does not drain milk effectively and becomes swollen. Wearing a tight-fitting nursing bra or having difficulty with latching may cause plugged milk ducts. Not drinking enough water (8-10 c [1.9-2.4 L] per day) can contribute to plugged milk ducts. Once a duct has become plugged, hard lumps, soreness, and redness may develop in your breast. Solution  Do not delay feedings. Feed your baby frequently and try to empty your breasts of milk at each feeding. Try breastfeeding from the affected side first so there is a better chance that the milk will drain completely from  that breast. Apply warm, moist towels to your breasts for 5-10 minutes before feeding. Alternatively, a hot shower right before breastfeeding can provide the moist heat that can encourage milk flow. Gentle massage of the sore area before and during a feeding may also help. Avoid wearing tight clothing or bras that put pressure on your breasts. Wear bras that offer good support to your breasts, but avoid underwire bras. If you have a plugged milk duct and develop a fever, you need to see your health care provider. Challenge-Mastitis  Mastitis is inflammation of your breast. It usually is caused by a bacterial infection and can cause flu-like symptoms. You may develop redness in your breast and a fever.  Often when mastitis occurs, your breast becomes firm, warm, and very painful. The most common causes of mastitis are poor latching, ineffective sucking from your baby, consistent pressure on your breast (possibly from wearing a tight-fitting bra or shirt that restricts the milk flow), unusual stress or fatigue, or missed feedings. Solution  You will be given antibiotic medicine to treat the infection. It is still important to breastfeed frequently to empty your breasts. Continuing to breastfeed while you recover from mastitis will not harm your baby. Make sure your baby is positioned properly during every feeding. Apply moist heat to your breasts for a few minutes before feeding to help the milk flow and to help your breasts empty more easily. Challenge-Thrush  Ginette Pitmanhrush is a yeast infection that can form on your nipples, in your breast, or in your baby's mouth. It causes itching, soreness, burning or stabbing pain, and sometimes a rash. Solution  You will be given a medicated ointment for your nipples, and your baby will be given a liquid medicine for his or her mouth. It is important that you and your baby are treated at the same time because thrush can be passed between you and your baby. Change disposable nursing pads often. Any bras, towels, or clothing that come in contact with infected areas of your body or your baby's body need to be washed in very hot water every day. Wash your hands and your baby's hands often. All pacifiers, bottle nipples, or toys your baby puts in his or her mouth should be boiled once a day for 20 minutes. After 1 week of treatment, discard pacifiers and bottle nipples and buy new ones. All breast pump parts that touch the milk need to be boiled for 20 minutes every day. Challenge-Low Milk Supply  You may not be producing enough milk if your baby is not gaining the proper amount of weight. Breast milk production is based on a supply-and-demand system. Your milk supply depends on  how frequently and effectively your baby empties your breast. Solution  The more you breastfeed and pump, the more breast milk you will produce. It is important that your baby empties at least one of your breasts at each feeding. If this is not happening, then use a breast pump or hand express any milk that remains. This will help to drain as much milk as possible at each feeding. It will also signal your body to produce more milk. If your baby is not emptying your breasts, it may be due to latching, sucking, or positioning problems. If low milk supply continues after addressing these issues, contact your health care provider or a lactation specialist as soon as possible. Challenge-Inverted or Flat Nipples  Some women have nipples that turn inward instead of protruding outward. Other women have nipples that  are flat. Inverted or flat nipples can sometimes make it more difficult for your baby to latch onto your breast. Solution  You may be given a small device that pulls out inverted nipples. This device should be applied right before your baby is brought to your breast. You can also try using a breast pump for a short time before placing the baby at your breast. The pump can pull your nipple outwards to help your infant latch more easily. The baby's sucking motion will help the inverted nipple protrude as well. If you have flat nipples, encourage your baby to latch onto your breast and feed frequently in the early days after birth. This will give your baby practice latching on correctly while your breast is still soft. When your milk supply increases, between the second and fifth day after birth and your breasts become full, your baby will have an easier time latching. Contact a lactation consultant if you still have concerns. She or he can teach you additional techniques to address breastfeeding problems related to nipple shape and position. Where to find more information: [employer]:  www.llli.org This information is not intended to replace advice given to you by your health care provider. Make sure you discuss any questions you have with your health care provider. Document Released: 02/22/2006 Document Revised: 02/12/2016 Document Reviewed: 02/24/2013 Elsevier Interactive Patient Education  2017 [employer].   Labor Induction Labor induction is when steps are taken to cause a pregnant woman to begin the labor process. Most women go into labor on their own between 37 weeks and 42 weeks of the pregnancy. When this does not happen or when there is a medical need, methods may be used to induce labor. Labor induction causes a pregnant woman's uterus to contract. It also causes the cervix to soften (ripen), open (dilate), and thin out (efface). Usually, labor is not induced before 39 weeks of the pregnancy unless there is a problem with the baby or mother. Before inducing labor, your health care provider will consider a number of factors, including the following:  The medical condition of you and the baby.  How many weeks along you are.  The status of the baby's lung maturity.  The condition of the cervix.  The position of the baby. What are the reasons for labor induction? Labor may be induced for the following reasons:  The health of the baby or mother is at risk.  The pregnancy is overdue by 1 week or more.  The water breaks but labor does not start on its own.  The mother has a health condition or serious illness, such as high blood pressure, infection, placental abruption, or diabetes.  The amniotic fluid amounts are low around the baby.  The baby is distressed. Convenience or wanting the baby to be born on a certain date is not a reason for inducing labor. What methods are used for labor induction? Several methods of labor induction may be used, such as:  Prostaglandin medicine. This medicine causes the cervix to dilate and ripen. The medicine will also  start contractions. It can be taken by mouth or by inserting a suppository into the vagina.  Inserting a thin tube (catheter) with a balloon on the end into the vagina to dilate the cervix. Once inserted, the balloon is expanded with water, which causes the cervix to open.  Stripping the membranes. Your health care provider separates amniotic sac tissue from the cervix, causing the cervix to be stretched and  causing the release of a hormone called progesterone. This may cause the uterus to contract. It is often done during an office visit. You will be sent home to wait for the contractions to begin. You will then come in for an induction.  Breaking the water. Your health care provider makes a hole in the amniotic sac using a small instrument. Once the amniotic sac breaks, contractions should begin. This may still take hours to see an effect.  Medicine to trigger or strengthen contractions. This medicine is given through an IV access tube inserted into a vein in your arm. All of the methods of induction, besides stripping the membranes, will be done in the hospital. Induction is done in the hospital so that you and the baby can be carefully monitored. How long does it take for labor to be induced? Some inductions can take up to 2-3 days. Depending on the cervix, it usually takes less time. It takes longer when you are induced early in the pregnancy or if this is your first pregnancy. If a mother is still pregnant and the induction has been going on for 2-3 days, either the mother will be sent home or a cesarean delivery will be needed. What are the risks associated with labor induction? Some of the risks of induction include:  Changes in fetal heart rate, such as too high, too low, or erratic.  Fetal distress.  Chance of infection for the mother and baby.  Increased chance of having a cesarean delivery.  Breaking off (abruption) of the placenta from the uterus (rare).  Uterine rupture (very  rare). When induction is needed for medical reasons, the benefits of induction may outweigh the risks. What are some reasons for not inducing labor? Labor induction should not be done if:  It is shown that your baby does not tolerate labor.  You have had previous surgeries on your uterus, such as a myomectomy or the removal of fibroids.  Your placenta lies very low in the uterus and blocks the opening of the cervix (placenta previa).  Your baby is not in a head-down position.  The umbilical cord drops down into the birth canal in front of the baby. This could cut off the baby's blood and oxygen supply.  You have had a previous cesarean delivery.  There are unusual circumstances, such as the baby being extremely premature. This information is not intended to replace advice given to you by your health care provider. Make sure you discuss any questions you have with your health care provider. Document Released: 01/20/2007 Document Revised: 02/06/2016 Document Reviewed: 03/30/2013 Elsevier Interactive Patient Education  2017 [employer].

## 2016-10-26 ENCOUNTER — Telehealth (HOSPITAL_COMMUNITY): Payer: Self-pay | Admitting: *Deleted

## 2016-10-26 NOTE — Telephone Encounter (Signed)
Preadmission screen  

## 2016-10-27 ENCOUNTER — Encounter (HOSPITAL_COMMUNITY): Payer: Self-pay

## 2016-10-27 ENCOUNTER — Inpatient Hospital Stay (HOSPITAL_COMMUNITY): Payer: BLUE CROSS/BLUE SHIELD | Admitting: Anesthesiology

## 2016-10-27 ENCOUNTER — Inpatient Hospital Stay (HOSPITAL_COMMUNITY)
Admission: RE | Admit: 2016-10-27 | Discharge: 2016-10-30 | DRG: 775 | Disposition: A | Discharge status: Home / Self Care | Payer: BLUE CROSS/BLUE SHIELD | Source: Ambulatory Visit | Attending: Obstetrics and Gynecology | Admitting: Obstetrics and Gynecology

## 2016-10-27 DIAGNOSIS — O9989 Other specified diseases and conditions complicating pregnancy, childbirth and the puerperium: Secondary | ICD-10-CM

## 2016-10-27 DIAGNOSIS — O480 Post-term pregnancy: Secondary | ICD-10-CM | POA: Diagnosis present

## 2016-10-27 DIAGNOSIS — Z2839 Other underimmunization status: Secondary | ICD-10-CM

## 2016-10-27 DIAGNOSIS — O411230 Chorioamnionitis, third trimester, not applicable or unspecified: Secondary | ICD-10-CM | POA: Diagnosis present

## 2016-10-27 DIAGNOSIS — Z3A41 41 weeks gestation of pregnancy: Secondary | ICD-10-CM

## 2016-10-27 DIAGNOSIS — O9962 Diseases of the digestive system complicating childbirth: Secondary | ICD-10-CM | POA: Diagnosis present

## 2016-10-27 DIAGNOSIS — Z283 Underimmunization status: Secondary | ICD-10-CM

## 2016-10-27 DIAGNOSIS — K219 Gastro-esophageal reflux disease without esophagitis: Secondary | ICD-10-CM | POA: Diagnosis present

## 2016-10-27 DIAGNOSIS — Z3403 Encounter for supervision of normal first pregnancy, third trimester: Secondary | ICD-10-CM

## 2016-10-27 LAB — CBC
HEMATOCRIT: 36.4 % (ref 36.0–46.0)
HEMOGLOBIN: 12.3 g/dL (ref 12.0–15.0)
MCH: 27.6 pg (ref 26.0–34.0)
MCHC: 33.8 g/dL (ref 30.0–36.0)
MCV: 81.8 fL (ref 78.0–100.0)
Platelets: 164 10*3/uL (ref 150–400)
RBC: 4.45 MIL/uL (ref 3.87–5.11)
RDW: 15.7 % — ABNORMAL HIGH (ref 11.5–15.5)
WBC: 10.9 10*3/uL — ABNORMAL HIGH (ref 4.0–10.5)

## 2016-10-27 LAB — TYPE AND SCREEN
ABO/RH(D): A POS
Antibody Screen: NEGATIVE

## 2016-10-27 LAB — ABO/RH: ABO/RH(D): A POS

## 2016-10-27 MED ORDER — OXYTOCIN BOLUS FROM INFUSION
500.0000 mL | Freq: Once | INTRAVENOUS | Status: AC
  Administered 2016-10-28: 500 mL via INTRAVENOUS

## 2016-10-27 MED ORDER — EPHEDRINE 5 MG/ML INJ: 10.0000 mg | INTRAVENOUS | Status: DC | PRN

## 2016-10-27 MED ORDER — ONDANSETRON HCL 4 MG/2ML IJ SOLN
4.0000 mg | Freq: Four times a day (QID) | INTRAMUSCULAR | Status: DC | PRN
  Administered 2016-10-28 (×2): 4 mg via INTRAVENOUS
  Filled 2016-10-27 (×2): qty 2

## 2016-10-27 MED ORDER — LACTATED RINGERS IV SOLN: 500.0000 mL | INTRAVENOUS | Status: DC | PRN

## 2016-10-27 MED ORDER — OXYCODONE-ACETAMINOPHEN 5-325 MG PO TABS: 1.0000 | ORAL_TABLET | ORAL | Status: DC | PRN

## 2016-10-27 MED ORDER — OXYTOCIN 40 UNITS IN LACTATED RINGERS INFUSION - SIMPLE MED
2.5000 [IU]/h | INTRAVENOUS | Status: DC
  Filled 2016-10-27 (×2): qty 1000

## 2016-10-27 MED ORDER — LACTATED RINGERS IV SOLN
INTRAVENOUS | Status: DC
  Administered 2016-10-27 – 2016-10-28 (×3): via INTRAVENOUS

## 2016-10-27 MED ORDER — ACETAMINOPHEN 325 MG PO TABS: 650.0000 mg | ORAL_TABLET | ORAL | Status: DC | PRN

## 2016-10-27 MED ORDER — LACTATED RINGERS IV SOLN
500.0000 mL | Freq: Once | INTRAVENOUS | Status: AC
Start: 2016-10-27 — End: 2016-10-28
  Administered 2016-10-28: 500 mL via INTRAVENOUS

## 2016-10-27 MED ORDER — DIPHENHYDRAMINE HCL 50 MG/ML IJ SOLN: 12.5000 mg | INTRAMUSCULAR | Status: DC | PRN

## 2016-10-27 MED ORDER — HYDROXYZINE HCL 10 MG PO TABS
10.0000 mg | ORAL_TABLET | Freq: Once | ORAL | Status: AC
  Administered 2016-10-27: 10 mg via ORAL
  Filled 2016-10-27: qty 1

## 2016-10-27 MED ORDER — FENTANYL 2.5 MCG/ML BUPIVACAINE 1/10 % EPIDURAL INFUSION (WH - ANES)
14.0000 mL/h | INTRAMUSCULAR | Status: DC | PRN
  Administered 2016-10-27 – 2016-10-28 (×2): 14 mL/h via EPIDURAL
  Filled 2016-10-27 (×2): qty 100

## 2016-10-27 MED ORDER — LACTATED RINGERS IV SOLN: 500.0000 mL | Freq: Once | INTRAVENOUS | Status: DC

## 2016-10-27 MED ORDER — SOD CITRATE-CITRIC ACID 500-334 MG/5ML PO SOLN: 30.0000 mL | ORAL | Status: DC | PRN

## 2016-10-27 MED ORDER — FENTANYL CITRATE (PF) 100 MCG/2ML IJ SOLN: 50.0000 ug | INTRAMUSCULAR | Status: DC | PRN

## 2016-10-27 MED ORDER — TERBUTALINE SULFATE 1 MG/ML IJ SOLN: 0.2500 mg | Freq: Once | INTRAMUSCULAR | Status: DC | PRN

## 2016-10-27 MED ORDER — PHENYLEPHRINE 40 MCG/ML (10ML) SYRINGE FOR IV PUSH (FOR BLOOD PRESSURE SUPPORT): 80.0000 ug | PREFILLED_SYRINGE | INTRAVENOUS | Status: DC | PRN

## 2016-10-27 MED ORDER — HYDROXYZINE HCL 50 MG PO TABS
25.0000 mg | ORAL_TABLET | Freq: Once | ORAL | Status: AC
  Administered 2016-10-28: 25 mg via ORAL
  Filled 2016-10-27: qty 1

## 2016-10-27 MED ORDER — FLEET ENEMA 7-19 GM/118ML RE ENEM: 1.0000 | ENEMA | RECTAL | Status: DC | PRN

## 2016-10-27 MED ORDER — DIPHENHYDRAMINE HCL 50 MG/ML IJ SOLN
12.5000 mg | INTRAMUSCULAR | Status: DC | PRN
  Administered 2016-10-27 (×2): 12.5 mg via INTRAVENOUS
  Filled 2016-10-27: qty 1

## 2016-10-27 MED ORDER — LIDOCAINE HCL (PF) 1 % IJ SOLN
30.0000 mL | INTRAMUSCULAR | Status: AC | PRN
  Administered 2016-10-28: 30 mL via SUBCUTANEOUS
  Filled 2016-10-27: qty 30

## 2016-10-27 MED ORDER — OXYCODONE-ACETAMINOPHEN 5-325 MG PO TABS: 2.0000 | ORAL_TABLET | ORAL | Status: DC | PRN

## 2016-10-27 MED ORDER — OXYTOCIN 40 UNITS IN LACTATED RINGERS INFUSION - SIMPLE MED
1.0000 m[IU]/min | INTRAVENOUS | Status: DC
  Administered 2016-10-27: 2 m[IU]/min via INTRAVENOUS
  Filled 2016-10-27: qty 1000

## 2016-10-27 MED ORDER — PHENYLEPHRINE 40 MCG/ML (10ML) SYRINGE FOR IV PUSH (FOR BLOOD PRESSURE SUPPORT)
80.0000 ug | PREFILLED_SYRINGE | INTRAVENOUS | Status: DC | PRN
  Filled 2016-10-27: qty 10

## 2016-10-27 MED ORDER — LIDOCAINE HCL (PF) 1 % IJ SOLN
INTRAMUSCULAR | Status: DC | PRN
  Administered 2016-10-27 (×2): 6 mL via EPIDURAL

## 2016-10-27 MED ORDER — PROMETHAZINE HCL 25 MG/ML IJ SOLN: 12.5000 mg | Freq: Once | INTRAMUSCULAR | Status: DC

## 2016-10-27 NOTE — Anesthesia Pain Management Evaluation Note (Signed)
  [facility] Pain Management Visit Note  Patient: Brittany Welch, 31 y.o., female  "Hello I am a [facility] of the [facility]. We have an anesthesia team available at all times to provide care throughout the hospital, including epidural management and anesthesia for C-section. I don't know your plan for the delivery whether it a natural birth, water birth, IV sedation, nitrous supplementation, doula or epidural, but we want to meet your pain goals."   1.Was your pain managed to your expectations on prior hospitalizations?   No prior hospitalizations  2.What is your expectation for pain management during this hospitalization?     Labor support without medications, Epidural, IV pain meds and Nitrous Oxide  3.How can we help you reach that goal? Patient unsure of plan.  Will determine plan as labor progresses.  Patient is aware of pain control options.  Record the patient's initial score and the patient's pain goal.   Pain: 0  Pain Goal: 7 [facility] wants you to be able to say your pain was always managed very well.  Darrio Bade L 10/27/2016

## 2016-10-27 NOTE — Anesthesia Preprocedure Evaluation (Signed)
Anesthesia Evaluation  Patient identified by MRN, date of birth, ID band Patient awake    Reviewed: Allergy & Precautions, NPO status , Patient's Chart, lab work & pertinent test results  History of Anesthesia Complications Negative for: history of anesthetic complications  Airway Mallampati: II  TM Distance: >3 FB Neck ROM: Full    Dental  (+) Dental Advisory Given   Pulmonary neg pulmonary ROS,    breath sounds clear to auscultation       Cardiovascular negative cardio ROS   Rhythm:Regular Rate:Normal     Neuro/Psych negative neurological ROS     GI/Hepatic Neg liver ROS, GERD  ,  Endo/Other  negative endocrine ROS  Renal/GU negative Renal ROS     Musculoskeletal   Abdominal   Peds  Hematology plt 164K   Anesthesia Other Findings   Reproductive/Obstetrics (+) Pregnancy                             Anesthesia Physical Anesthesia Plan  ASA: II  Anesthesia Plan: Epidural   Post-op Pain Management:    Induction:   Airway Management Planned: Natural Airway  Additional Equipment:   Intra-op Plan:   Post-operative Plan:   Informed Consent: I have reviewed the patients History and Physical, chart, labs and discussed the procedure including the risks, benefits and alternatives for the proposed anesthesia with the patient or authorized representative who has indicated his/her understanding and acceptance.   Dental advisory given  Plan Discussed with:   Anesthesia Plan Comments: (Patient identified. Risks/Benefits/Options discussed with patient including but not limited to bleeding, infection, nerve damage, paralysis, failed block, incomplete pain control, headache, blood pressure changes, nausea, vomiting, reactions to medication both or allergic, itching and postpartum back pain. Confirmed with bedside nurse the patient's most recent platelet count. Confirmed with patient that  they are not currently taking any anticoagulation, have any bleeding history or any family history of bleeding disorders. Patient expressed understanding and wished to proceed. All questions were answered. )        Anesthesia Quick Evaluation

## 2016-10-27 NOTE — H&P (Signed)
LABOR AND DELIVERY ADMISSION HISTORY AND PHYSICAL NOTE  Lenox PondsKristen B Delcid is a 31 y.o. female G1P0 with IUP at 6831w0d by LMP presenting for IOL for postdates pregnancy.   She reports positive fetal movement. She denies leakage of fluid or vaginal bleeding.  Prenatal History/Complications:  None  Past Medical History: History reviewed. No pertinent past medical history.  Past Surgical History: Past Surgical History:  Procedure Laterality Date  . DENTAL SURGERY      Obstetrical History: OB History    Gravida Para Term Preterm AB Living   1             SAB TAB Ectopic Multiple Live Births                  Social History: Social History   Social History  . Marital status: Married    Spouse name: N/A  . Number of children: N/A  . Years of education: N/A   Social History Main Topics  . Smoking status: Never Smoker  . Smokeless tobacco: Never Used  . Alcohol use No  . Drug use: No  . Sexual activity: Yes   Other Topics Concern  . None   Social History Narrative  . None    Family History: History reviewed. No pertinent family history.  Allergies: Allergies  Allergen Reactions  . Latex Itching and Other (See Comments)    Itching and redness at site where latex was.     Prescriptions Prior to Admission  Medication Sig Dispense Refill Last Dose  . Prenatal Vit-Fe Fumarate-FA (PRENATAL MULTIVITAMIN) TABS tablet Take 1 tablet by mouth daily at 12 noon.   Past Week at Unknown time     Review of Systems   All systems reviewed and negative except as stated in HPI  Blood pressure 124/85, pulse 84, temperature 99 F (37.2 C), temperature source Oral, resp. rate 18, height 5\' 6"  (1.676 m), weight 154 lb (69.9 kg), last menstrual period 01/29/2016. General appearance: alert, cooperative, appears stated age and no distress Lungs: clear to auscultation bilaterally Heart: regular rate and rhythm Abdomen: soft, non-tender; bowel sounds normal Extremities: No calf  swelling or tenderness Presentation: cephalic Fetal monitoring: 150 bpm, mod var, +accels, no decels Uterine activity: Irritable, infrequent Dilation: 2 Station: -2 Exam by:: Reonna Finlayson   Prenatal labs: ABO, Rh: A/POS/-- (08/07 0919) Antibody: NEG (08/07 0919) Rubella: !Error! RPR: NON REAC (11/14 1547)  HBsAg: NEGATIVE (08/07 0919)  HIV: NONREACTIVE (11/14 1547)  GBS: Negative (01/11 0000)  1 hr Glucola: 121 Genetic screening:  Declined Anatomy US: Normal, female  Prenatal Transfer Tool  Maternal Diabetes: No Genetic Screening: Declined Maternal Ultrasounds/Referrals: Normal Fetal Ultrasounds or other Referrals:  None Maternal Substance Abuse:  No Significant Maternal Medications:  None Significant Maternal Lab Results: Lab values include: Group B Strep negative, Other: Rubella non-immune  Results for orders placed or performed during the hospital encounter of 10/27/16 (from the past 24 hour(s))  CBC   Collection Time: 10/27/16  8:45 AM  Result Value Ref Range   WBC 10.9 (H) 4.0 - 10.5 K/uL   RBC 4.45 3.87 - 5.11 MIL/uL   Hemoglobin 12.3 12.0 - 15.0 g/dL   HCT 16.136.4 09.636.0 - 04.546.0 %   MCV 81.8 78.0 - 100.0 fL   MCH 27.6 26.0 - 34.0 pg   MCHC 33.8 30.0 - 36.0 g/dL   RDW 40.915.7 (H) 81.111.5 - 91.415.5 %   Platelets 164 150 - 400 K/uL    Patient Active Problem List  Diagnosis Date Noted  . Post-dates pregnancy 10/27/2016  . Rubella non-immune status, antepartum 10/16/2016  . Supervision of normal first pregnancy 04/20/2016    Assessment: Jackee Glasner Braddy is a 31 y.o. G1P0 at [redacted]w[redacted]d here for IOL 2/2 post dates pregnancy  #Labor: Foley bulb was placed successfully, will start low dose pitocin while FB in place  #Pain:  IV pain meds prn, epidural on request #FWB:  Cat I #ID:   GBS Neg #MOF:  Breast #MOC: None, condoms and family planning #Circ:   Desires  Jen Mow, DO OB Fellow [facility], [facility] 10/27/2016, 10:27 AM

## 2016-10-27 NOTE — Progress Notes (Signed)
Patient ID: Brittany SkeeterKristen B Welch, female   DOB: 07/20/1986, 31 y.o.   MRN: [id number]  S: Patient seen & examined for progress of labor. Patient comfortable with epidural now. Patient does not want her water broken yet.     O:  Vitals:   10/27/16 2201 10/27/16 2230 10/27/16 2300 10/27/16 2301  BP: 121/71 125/73  121/77  Pulse: 81 93 84 87  Resp: 18 18  18   Temp:      TempSrc:      SpO2:  99% 99%   Weight:      Height:        Dilation: 5 Effacement (%): 80 Cervical Position: Posterior Station: -1, 0 Presentation: Vertex Exam by:: Dr. Omer JackMumaw   FHT: 140 bpm, mod var, +accels, no decels TOCO: q2-604min   A/P: Continue pitocin Continue expectant management Anticipate SVD

## 2016-10-27 NOTE — Anesthesia Procedure Notes (Signed)
Epidural Patient location during procedure: OB Start time: 10/27/2016 9:07 PM End time: 10/27/2016 9:22 PM  Staffing Anesthesiologist: Jairo BenJACKSON, Abigail Teall Performed: anesthesiologist   Preanesthetic Checklist Completed: patient identified, surgical consent, pre-op evaluation, timeout performed, IV checked, risks and benefits discussed and monitors and equipment checked  Epidural Patient position: sitting Prep: site prepped and draped and DuraPrep Patient monitoring: blood pressure, continuous pulse ox and heart rate Approach: midline Location: L2-L3 Injection technique: LOR air  Needle:  Needle type: Tuohy  Needle gauge: 17 G Needle length: 9 cm Needle insertion depth: 4 cm Catheter type: closed end flexible Catheter size: 19 Gauge Catheter at skin depth: 9 cm Test dose: negative (1% lidocaine)  Assessment Events: blood not aspirated, injection not painful, no injection resistance, negative IV test and no paresthesia  Additional Notes Pt identified in Labor room.  Monitors applied. Working IV access confirmed. Sterile prep, drape lumbar spine.  1% lido local L 2,3.  #17ga Touhy LOR air at 4 cm L 2,3, cath in easily to 9 cm skin. Test dose OK, cath dosed and infusion begun.  Patient asymptomatic, VSS, no heme aspirated, tolerated well.  Brittany Welch, MDReason for block:procedure for pain

## 2016-10-28 ENCOUNTER — Encounter (HOSPITAL_COMMUNITY): Payer: Self-pay

## 2016-10-28 DIAGNOSIS — Z3A41 41 weeks gestation of pregnancy: Secondary | ICD-10-CM

## 2016-10-28 DIAGNOSIS — O480 Post-term pregnancy: Secondary | ICD-10-CM

## 2016-10-28 LAB — RPR: RPR Ser Ql: NONREACTIVE

## 2016-10-28 MED ORDER — ONDANSETRON HCL 4 MG/2ML IJ SOLN
4.0000 mg | INTRAMUSCULAR | Status: DC | PRN
Start: 2016-10-28 — End: 2016-10-30

## 2016-10-28 MED ORDER — ZOLPIDEM TARTRATE 5 MG PO TABS: 5.0000 mg | ORAL_TABLET | Freq: Every evening | ORAL | Status: DC | PRN

## 2016-10-28 MED ORDER — OXYCODONE HCL 5 MG PO TABS: 5.0000 mg | ORAL_TABLET | ORAL | Status: DC | PRN

## 2016-10-28 MED ORDER — SODIUM CHLORIDE 0.9 % IV SOLN: 250.0000 mL | INTRAVENOUS | Status: DC | PRN

## 2016-10-28 MED ORDER — DIPHENHYDRAMINE HCL 25 MG PO CAPS: 25.0000 mg | ORAL_CAPSULE | Freq: Four times a day (QID) | ORAL | Status: DC | PRN

## 2016-10-28 MED ORDER — TETANUS-DIPHTH-ACELL PERTUSSIS 5-2.5-18.5 LF-MCG/0.5 IM SUSP: 0.5000 mL | Freq: Once | INTRAMUSCULAR | Status: DC

## 2016-10-28 MED ORDER — SODIUM CHLORIDE 0.9% FLUSH: 3.0000 mL | INTRAVENOUS | Status: DC | PRN

## 2016-10-28 MED ORDER — SODIUM CHLORIDE 0.9 % IV SOLN
2.0000 g | Freq: Four times a day (QID) | INTRAVENOUS | Status: DC
  Administered 2016-10-28: 2 g via INTRAVENOUS
  Filled 2016-10-28 (×3): qty 2000

## 2016-10-28 MED ORDER — DIBUCAINE 1 % RE OINT: 1.0000 "application " | TOPICAL_OINTMENT | RECTAL | Status: DC | PRN

## 2016-10-28 MED ORDER — SIMETHICONE 80 MG PO CHEW: 80.0000 mg | CHEWABLE_TABLET | ORAL | Status: DC | PRN

## 2016-10-28 MED ORDER — MISOPROSTOL 200 MCG PO TABS
ORAL_TABLET | ORAL | Status: AC
  Administered 2016-10-28: 1000 ug
  Filled 2016-10-28: qty 5

## 2016-10-28 MED ORDER — SENNOSIDES-DOCUSATE SODIUM 8.6-50 MG PO TABS
2.0000 | ORAL_TABLET | ORAL | Status: DC
  Administered 2016-10-28 – 2016-10-29 (×2): 2 via ORAL
  Filled 2016-10-28 (×2): qty 2

## 2016-10-28 MED ORDER — PRENATAL MULTIVITAMIN CH
1.0000 | ORAL_TABLET | Freq: Every day | ORAL | Status: DC
  Administered 2016-10-30: 1 via ORAL
  Filled 2016-10-28 (×2): qty 1

## 2016-10-28 MED ORDER — IBUPROFEN 600 MG PO TABS
600.0000 mg | ORAL_TABLET | Freq: Four times a day (QID) | ORAL | Status: DC
  Administered 2016-10-28 – 2016-10-30 (×4): 600 mg via ORAL
  Filled 2016-10-28 (×6): qty 1

## 2016-10-28 MED ORDER — MEASLES, MUMPS & RUBELLA VAC ~~LOC~~ INJ
0.5000 mL | INJECTION | Freq: Once | SUBCUTANEOUS | Status: DC
  Filled 2016-10-28: qty 0.5

## 2016-10-28 MED ORDER — BENZOCAINE-MENTHOL 20-0.5 % EX AERO
1.0000 "application " | INHALATION_SPRAY | CUTANEOUS | Status: DC | PRN
  Filled 2016-10-28: qty 56

## 2016-10-28 MED ORDER — ACETAMINOPHEN 650 MG RE SUPP
650.0000 mg | Freq: Once | RECTAL | Status: AC
  Administered 2016-10-28: 650 mg via RECTAL
  Filled 2016-10-28: qty 1

## 2016-10-28 MED ORDER — COCONUT OIL OIL
1.0000 "application " | TOPICAL_OIL | Status: DC | PRN
  Administered 2016-10-30: 1 via TOPICAL
  Filled 2016-10-28: qty 120

## 2016-10-28 MED ORDER — MISOPROSTOL 200 MCG PO TABS: 1000.0000 ug | ORAL_TABLET | Freq: Once | ORAL | Status: DC

## 2016-10-28 MED ORDER — SODIUM CHLORIDE 0.9% FLUSH: 3.0000 mL | Freq: Two times a day (BID) | INTRAVENOUS | Status: DC

## 2016-10-28 MED ORDER — WITCH HAZEL-GLYCERIN EX PADS: 1.0000 "application " | MEDICATED_PAD | CUTANEOUS | Status: DC | PRN

## 2016-10-28 MED ORDER — ONDANSETRON HCL 4 MG PO TABS
4.0000 mg | ORAL_TABLET | ORAL | Status: DC | PRN
Start: 2016-10-28 — End: 2016-10-30

## 2016-10-28 MED ORDER — DEXTROSE 5 % IV SOLN
160.0000 mg | Freq: Three times a day (TID) | INTRAVENOUS | Status: DC
  Administered 2016-10-28: 160 mg via INTRAVENOUS
  Filled 2016-10-28 (×2): qty 4

## 2016-10-28 MED ORDER — ACETAMINOPHEN 325 MG PO TABS
650.0000 mg | ORAL_TABLET | ORAL | Status: DC | PRN
  Administered 2016-10-28 – 2016-10-29 (×3): 650 mg via ORAL
  Filled 2016-10-28 (×3): qty 2

## 2016-10-28 MED ORDER — OXYTOCIN 40 UNITS IN LACTATED RINGERS INFUSION - SIMPLE MED: 2.5000 [IU]/h | INTRAVENOUS | Status: DC | PRN

## 2016-10-28 NOTE — Anesthesia Postprocedure Evaluation (Signed)
Anesthesia Post Note  Patient: Brittany Welch  Procedure(s) Performed: * No procedures listed *  Patient location during evaluation: Mother Baby Anesthesia Type: Epidural Level of consciousness: awake and alert and oriented Pain management: satisfactory to patient Vital Signs Assessment: post-procedure vital signs reviewed and stable Respiratory status: spontaneous breathing and nonlabored ventilation Cardiovascular status: stable Postop Assessment: no headache, no backache, no signs of nausea or vomiting, adequate PO intake and patient able to bend at knees (patient up walking) Anesthetic complications: no        Last Vitals:  Vitals:   10/28/16 1631 10/28/16 1945  BP: 109/66   Pulse: 97   Resp: 16   Temp: 37.7 C 37.8 C    Last Pain:  Vitals:   10/28/16 1945  TempSrc: Oral  PainSc:    Pain Goal: Patients Stated Pain Goal: 8 (10/27/16 1415)               Madison HickmanGREGORY,Rafan Sanders

## 2016-10-28 NOTE — Lactation Note (Signed)
This note was copied from a baby's chart. Lactation Consultation Note  Patient Name: Brittany Welch WUJWJ'XToday's Date: 10/28/2016 Reason for consult: Follow-up assessment   Follow up with mom of 5 hour old infant. Infant has not latched since birth. Infant awake and alert and was placed at breast, he took nipple in mouth and did not suckle. Enc mom to keep him STS and to call for assistance as needed.    Maternal Data Formula Feeding for Exclusion: No Has patient been taught Hand Expression?: Yes Does the patient have breastfeeding experience prior to this delivery?: No  Feeding Feeding Type: Breast Fed Length of feed: 0 min  LATCH Score/Interventions Latch: Too sleepy or reluctant, no latch achieved, no sucking elicited. Intervention(s): Skin to skin;Teach feeding cues;Waking techniques  Audible Swallowing: None  Type of Nipple: Everted at rest and after stimulation (flat at rest, everts wtih stim)  Comfort (Breast/Nipple): Soft / non-tender     Hold (Positioning): Assistance needed to correctly position infant at breast and maintain latch. Intervention(s): Breastfeeding basics reviewed;Support Pillows;Position options;Skin to skin  LATCH Score: 5  Lactation Tools Discussed/Used     Consult Status Consult Status: Follow-up Date: 10/29/16 Follow-up type: In-patient    Silas FloodSharon S Jestina Stephani 10/28/2016, 6:34 PM

## 2016-10-28 NOTE — Progress Notes (Signed)
Patient ID: Brittany Welch, female   DOB: 05/20/1986, 31 y.o.   MRN: [id number]  S: Patient seen & examined for progress of labor. Patient comfortable with epidural, has been able to sleep. She feels her anxiousness is improved. She would like to proceed with AROM.   O:  Vitals:   10/28/16 0130 10/28/16 0201 10/28/16 0231 10/28/16 0300  BP: 122/82 106/64 101/62 117/75  Pulse: 100 95 88 93  Resp: 18 18 16 18   Temp: 99.7 F (37.6 C)     TempSrc: Axillary     SpO2:      Weight:      Height:        Dilation: 5 Effacement (%): 80 Cervical Position: Posterior Station: -1, 0 Presentation: Vertex Exam by:: Dr. Omer JackMumaw   FHT: 135 bpm, mod var, +accels, no decels TOCO: q494min  AROM performed, clear fluid returned with mild bloody show. IUPC placed with ease.   A/P: AROM IUPC Continue expectant management Anticipate SVD

## 2016-10-28 NOTE — Progress Notes (Signed)
Patient ID: Brittany Welch, female   DOB: 08/31/1986, 31 y.o.   MRN: [id number]  S: Patient seen & examined for progress of labor. Patient comfortable with epidural. Patient pushing well with every contraction. Patient feeling warm and baby is tachycardic, maternal temp 100.35F orally.    O:  Vitals:   10/28/16 1110 10/28/16 1133 10/28/16 1204 10/28/16 1209  BP: 132/76 124/79 131/61   Pulse: (!) 115 (!) 127 (!) 138   Resp: 18     Temp: 98.2 F (36.8 C)   (!) 100.6 F (38.1 C)  TempSrc: Oral   Oral  SpO2:      Weight:      Height:        Dilation: 10 Dilation Complete Date: 10/28/16 Dilation Complete Time: 0900 Effacement (%): 80 Cervical Position: Posterior Station: 0 Presentation: Vertex Exam by:: stone rnc   FHT: 170bpm, mod var, no accels, early decels IUPC: near-adequate, q602min contractions   A/P: Triple I - Amp/Gent; Tylenol PR; IVF bolus Continue expectant management Anticipate SVD

## 2016-10-28 NOTE — Lactation Note (Signed)
This note was copied from a baby's chart. Lactation Consultation Note  Patient Name: Brittany Welch NWGNF'AToday's Date: 10/28/2016 Reason for consult: Initial assessment   Initial [facility] with [facility] Infant STS with mom and not interested in feeding. He is noted to have mild intermittent grunting. Mom undergoing extensive perineal repairs currently and in pain.   Enc mom to BF infant STS 8-12 x in 24 hours at first feeding cues. Enc mom to use lots of pillow and head support. Was not able to teach hand expression at this time, mom denies breast growth with pregnancy. Feeding log given with instructions for use and cleaning.   BF Recourses Handout and LC Brochure given, mom informed of IP/OP Services, BF Support Groups and LC phone #. Enc mom to call out for feeding assistance as needed.     Maternal Data Formula Feeding for Exclusion: No Has patient been taught Hand Expression?: No Does the patient have breastfeeding experience prior to this delivery?: No  Feeding    LATCH Score/Interventions                      Lactation Tools Discussed/Used WIC Program: No   Consult Status Consult Status: Follow-up Date: 10/29/16 Follow-up type: In-patient    Silas FloodSharon S Kahmari Koller 10/28/2016, 1:41 PM

## 2016-10-28 NOTE — Progress Notes (Signed)
Pharmacy Antibiotic Note  Brittany Welch is a 31 y.o. female admitted on 10/27/2016 with triple I.  Pharmacy has been consulted for gentamicin dosing.  Plan: 1. Gentamicin 160 mg IV q8h  2. Will need to order SCr if continued post partum  Height: 5\' 6"  (167.6 cm) Weight: 154 lb (69.9 kg) IBW/kg (Calculated) : 59.3  Temp (24hrs), Avg:99 F (37.2 C), Min:98 F (36.7 C), Max:100.6 F (38.1 C)   Recent Labs Lab 10/27/16 0845  WBC 10.9*    CrCl cannot be calculated (Patient's most recent lab result is older than the maximum 21 days allowed.).    Allergies  Allergen Reactions  . Latex Itching and Other (See Comments)    Itching and redness at site where exposed to latex      Thank you for allowing pharmacy to be a part of this patient's care.  Brittany Welch, Brittany Welch 10/28/2016 12:19 PM

## 2016-10-29 ENCOUNTER — Encounter (HOSPITAL_COMMUNITY): Payer: Self-pay

## 2016-10-29 MED ORDER — POLYETHYLENE GLYCOL 3350 17 G PO PACK
17.0000 g | PACK | Freq: Every day | ORAL | Status: DC | PRN
  Filled 2016-10-29: qty 1

## 2016-10-29 NOTE — Progress Notes (Signed)
POSTPARTUM PROGRESS NOTE  Post Partum Day 1 Subjective:  Brittany Welch is a 31 y.o. G1P1001 4184w1d s/p SVD.  No acute events overnight.  Pt denies problems with ambulating, voiding or po intake.  She denies nausea or vomiting.  Pain is well controlled.  She has had flatus. She has not had bowel movement.  Lochia Moderate. The patient is wanting to breast feed but baby has not been interested in feeding. She has been seen by a Advertising copywriterlactation consultant.   Objective: Blood pressure 100/63, pulse 74, temperature 98.3 F (36.8 C), temperature source Oral, resp. rate 16, height 5\' 6"  (1.676 m), weight 154 lb (69.9 kg), last menstrual period 01/29/2016, SpO2 98 %, unknown if currently breastfeeding.  Physical Exam:  General: alert, cooperative and no distress Lochia:moderate flow improving  Chest: CTAB Heart: RRR no m/r/g Abdomen: +BS, soft, slightly tender,  Uterine Fundus: firm, below umbilicus  DVT Evaluation: No calf swelling or tenderness Extremities: no edema   Recent Labs  10/27/16 0845  HGB 12.3  HCT 36.4    Assessment/Plan:  ASSESSMENT: Brittany Welch is a 31 y.o. G1P1001 684w1d s/p SVD  Plan for discharge tomorrow and Circumcision prior to discharge   LOS: 2 days   Caryl NeverMadison Tedrow, Student-PA PA-Student 10/29/2016, 7:34 AM   CNM attestation Post Partum Day #1  Brittany Welch is a 31 y.o. G1P1001 s/p SVD.  Pt denies problems with ambulating, voiding or po intake. Pain is well controlled.  Plan for birth control is condoms, natural family planning (NFP).  Method of Feeding: breast  PE:  BP 100/63 (BP Location: Right Arm)   Pulse 74   Temp 98.3 F (36.8 C) (Oral)   Resp 16   Ht 5\' 6"  (1.676 m)   Wt 69.9 kg (154 lb)   LMP 01/29/2016 (Exact Date)   SpO2 98%   Breastfeeding? Unknown   BMI 24.86 kg/m  Fundus firm  Plan for discharge: 10/30/16 Plan for circ today.  Cam HaiSHAW, KIMBERLY, CNM 9:31 AM  10/29/2016

## 2016-10-29 NOTE — Lactation Note (Addendum)
This note was copied from a baby's chart. Lactation Consultation Note; RN reports baby has not really latched since birth. Several attempts but he will not suck. Mom reports she pumped but did not obtain any Colostrum. Reassurance given. Assisted mom with latch. Baby on and off the breast. When sucking on my finger, tongue movement is somewhat disorganized and high palate noted. Used #24 NS and he was still on and off the breast. RN as discussed supplementing with mom already. Placed a small amount of Alimentum in NS and he latched better and continued to suck. Nursed for 15 min. Mom reports this is the best he has done. Encouraged to pump q 3 hours to promote milk supply. Encouraged to watch for feeding cues and feed whenever she sees them or at least q 3 hours. Discussed cluster feeding and encouraged to nap this afternoon. No questions at present. To page for assist prn  Patient Name: Brittany Welch WUJWJ'XToday's Date: 10/29/2016 Reason for consult: Follow-up assessment   Maternal Data Formula Feeding for Exclusion: No Has patient been taught Hand Expression?: Yes Does the patient have breastfeeding experience prior to this delivery?: No  Feeding Feeding Type: Breast Fed Length of feed: 15 min  LATCH Score/Interventions Latch: Repeated attempts needed to sustain latch, nipple held in mouth throughout feeding, stimulation needed to elicit sucking reflex.  Audible Swallowing: A few with stimulation  Type of Nipple: Everted at rest and after stimulation Intervention(s): Double electric pump  Comfort (Breast/Nipple): Soft / non-tender     Hold (Positioning): Assistance needed to correctly position infant at breast and maintain latch. Intervention(s): Breastfeeding basics reviewed;Support Pillows  LATCH Score: 7  Lactation Tools Discussed/Used Tools: Nipple Shields Nipple shield size: 24 Pump Review: Setup, frequency, and cleaning Initiated by:: RN Date initiated::  10/29/16   Consult Status Consult Status: Follow-up Date: 10/29/16 Follow-up type: In-patient    Pamelia HoitWeeks, Analya Louissaint D 10/29/2016, 3:22 PM

## 2016-10-30 MED ORDER — IBUPROFEN 600 MG PO TABS: 600.0000 mg | ORAL_TABLET | Freq: Four times a day (QID) | ORAL | 0 refills | Status: DC

## 2016-10-30 MED ORDER — SENNOSIDES-DOCUSATE SODIUM 8.6-50 MG PO TABS: 2.0000 | ORAL_TABLET | Freq: Every day | ORAL | 0 refills | Status: AC

## 2016-10-30 NOTE — Lactation Note (Signed)
This note was copied from a baby's chart. Lactation Consultation Note Baby had been poor feeder w/little interest in BF. Was circumcised this evening which he became more sleepy. RN concerned about poor feedings asked LC to consult. When LC entered door, grandmother supplementing w/curve tip syring w/Alimentum into ends of NS. Noted baby suckling vigorously at breast. Mom wearing #24 NS, denies pain during BF. Encouraged mom to keep baby's head closer to mom, baby was popping NS and nipple in and out. Gave mom a folded blanket to place under her hand for support while holding baby's head. Encouraged to use so her hand will not get tire and baby will slip off of breast making nipple sore. Mom looked oddly at the grandmother at bedside.  Praised mom that baby was feeding well. Discussed cluster feeding possible during the night.  Reported to RN.  Patient Name: Brittany Welch's Date: 10/30/2016 Reason for consult: Follow-up assessment;Difficult latch   Maternal Data    Feeding Feeding Type: Formula Length of feed: 0 min (attempt; no latch)  LATCH Score/Interventions Latch: Repeated attempts needed to sustain latch, nipple held in mouth throughout feeding, stimulation needed to elicit sucking reflex. Intervention(s): Skin to skin Intervention(s): Adjust position  Audible Swallowing: None Intervention(s): Skin to skin  Type of Nipple: Flat Intervention(s): Double electric pump  Comfort (Breast/Nipple): Soft / non-tender     Hold (Positioning): Assistance needed to correctly position infant at breast and maintain latch. Intervention(s): Support Pillows;Skin to skin  LATCH Score: 5  Lactation Tools Discussed/Used Tools: Nipple Dorris CarnesShields;Pump Nipple shield size: 24 Breast pump type: Double-Electric Breast Pump   Consult Status Consult Status: Follow-up Date: 10/30/16 Follow-up type: In-patient    Charyl DancerCARVER, [name] 10/30/2016, 12:31 AM

## 2016-10-30 NOTE — Discharge Summary (Signed)
OB Discharge Summary     Patient Name: Brittany Welch DOB: 06/10/1986 MRN: [id number]  Date of admission: 10/27/2016 Delivering MD: Michaele OfferMUMAW, ELIZABETH WOODLAND   Date of discharge: 10/30/2016  Admitting diagnosis: INDUCTION Intrauterine pregnancy: 4983w1d     Secondary diagnosis:  Active Problems:   Post-dates pregnancy  Additional problems: Triple I     Discharge diagnosis: Term Pregnancy Delivered                                                                                                Post partum procedures:none  Augmentation: AROM, Pitocin and Foley Balloon  Complications: Intrauterine Inflammation or infection (Chorioamniotis)  Hospital course:  Induction of Labor With Vaginal Delivery   31 y.o. yo G1P1001 at 7383w1d was admitted to the hospital 10/27/2016 for induction of labor.  Indication for induction: Postdates.  Patient had an uncomplicated labor course as follows: Membrane Rupture Time/Date: 3:39 AM ,10/28/2016   Intrapartum Procedures: Episiotomy: None [1]                                         Lacerations:  2nd degree [3];Labial [10]  Patient had delivery of a Viable infant.  Information for the patient's newborn:  Merryl HackerMONEY, Boy Johnica [[id number]][[id number]]  Delivery Method: Vaginal, Spontaneous Delivery (Filed from Delivery Summary)   10/28/2016  Details of delivery can be found in separate delivery note.  Patient had a routine postpartum course. Patient is discharged home 10/30/16.  Physical exam  Vitals:   10/28/16 2024 10/28/16 2341 10/29/16 0500 10/30/16 0604  BP: 110/60  100/63 (!) 98/52  Pulse: 83  74 76  Resp: 16  16 18   Temp: 99.5 F (37.5 C) 98.1 F (36.7 C) 98.3 F (36.8 C) 98.2 F (36.8 C)  TempSrc: Oral Oral Oral   SpO2: 99%  98%   Weight:      Height:       General: alert, cooperative and no distress Lochia: appropriate Uterine Fundus: firm Incision: N/A DVT Evaluation: No evidence of DVT seen on physical exam. Negative Homan's sign. No  significant calf/ankle edema. Labs: Lab Results  Component Value Date   WBC 10.9 (H) 10/27/2016   HGB 12.3 10/27/2016   HCT 36.4 10/27/2016   MCV 81.8 10/27/2016   PLT 164 10/27/2016   CMP Latest Ref Rng & Units 07/11/2009  Glucose 70 - 99 mg/dL 284(X144(H)  BUN 6 - 23 mg/dL 15  Creatinine 0.4 - 1.2 mg/dL 0.9  Sodium 324135 - 401145 mEq/L 142  Potassium 3.5 - 5.1 mEq/L 4.0  Chloride 96 - 112 mEq/L 104  CO2 19 - 32 mEq/L 26  Calcium 8.4 - 10.5 mg/dL 9.8  Total Protein 6.0 - 8.3 g/dL 7.6  Total Bilirubin 0.3 - 1.2 mg/dL 0.8  Alkaline Phos 39 - 117 U/L 63  AST 0 - 37 U/L 25  ALT 0 - 35 U/L 12    Discharge instruction: per After Visit Summary and "Baby and Me Booklet".  After visit meds:  Allergies as of 10/30/2016      Reactions   Latex Itching, Other (See Comments)   Itching and redness at site where exposed to latex       Medication List    TAKE these medications   ibuprofen 600 MG tablet Commonly known as:  ADVIL,MOTRIN Take 1 tablet (600 mg total) by mouth every 6 (six) hours.   prenatal multivitamin Tabs tablet Take 1 tablet by mouth daily at 12 noon.   senna-docusate 8.6-50 MG tablet Commonly known as:  Senokot-S Take 2 tablets by mouth at bedtime.       Diet: carb modified diet  Activity: Advance as tolerated. Pelvic rest for 6 weeks.   Outpatient follow up:4 weeks Follow up Appt:No future appointments. Follow up Visit:No Follow-up on file.  Postpartum contraception: Condoms  Newborn Data: Live born female  Birth Weight: 9 lb 9.3 oz (4345 g) APGAR: 7, 9  Baby Feeding: Breast Disposition:home with mother   10/30/2016 Greig Right, CNM

## 2016-10-30 NOTE — Discharge Instructions (Signed)

## 2016-10-30 NOTE — Lactation Note (Signed)
This note was copied from a baby's chart. Lactation Consultation Note  Lactation requested related to sore nipples.  Nipples are cracked and irritated.  Comfort gels given to aid healing.  Baby was cueing so feeding was assessed. His latch was shallow and mom was reporting a pain of a 5. With position change it decreased to a 3 on the pain scale.  Karter did not engage well so a syringe SNS was initiated. The increase in volume encouraged a deeper latch and better jaw movements. Mom reported her pain disappeared with the change in suckling. Plan is to feed on cue using the SNS. Increase volume as needed. Post-pump 6 times in 24 hours.  Follow-up with pediatrician on Monday and lactation on Friday Feb. 23,2018.  Patient Name: Boy Jaynee EaglesKristen Rodak ZOXWR'UToday's Date: 10/30/2016 Reason for consult: Follow-up assessment;Breast/nipple pain   Maternal Data    Feeding Feeding Type: Breast Milk with Formula added Length of feed: 15 min  LATCH Score/Interventions Latch: Repeated attempts needed to sustain latch, nipple held in mouth throughout feeding, stimulation needed to elicit sucking reflex.  Audible Swallowing: A few with stimulation  Type of Nipple: Flat  Comfort (Breast/Nipple): Filling, red/small blisters or bruises, mild/mod discomfort     Hold (Positioning): Assistance needed to correctly position infant at breast and maintain latch.  LATCH Score: 5  Lactation Tools Discussed/Used Tools: Nipple Shields Nipple shield size: 24   Consult Status Consult Status: Follow-up Date: 11/06/16 Follow-up type: Out-patient    Soyla DryerJoseph, Espn Zeman 10/30/2016, 12:38 PM

## 2016-12-09 ENCOUNTER — Ambulatory Visit: Payer: BLUE CROSS/BLUE SHIELD | Admitting: Obstetrics & Gynecology

## 2016-12-21 ENCOUNTER — Encounter: Payer: Self-pay | Admitting: Obstetrics & Gynecology

## 2016-12-21 ENCOUNTER — Ambulatory Visit (INDEPENDENT_AMBULATORY_CARE_PROVIDER_SITE_OTHER): Payer: BLUE CROSS/BLUE SHIELD | Admitting: Obstetrics & Gynecology

## 2016-12-21 DIAGNOSIS — R32 Unspecified urinary incontinence: Secondary | ICD-10-CM

## 2016-12-21 NOTE — Progress Notes (Signed)
Post Partum Exam  Brittany Welch is a 31 y.o. G95P1001 (son)  female who presents for a postpartum visit. She is 8 weeks postpartum following a spontaneous vaginal delivery. I have fully reviewed the prenatal and intrapartum course. The delivery was at 41 gestational weeks.  Anesthesia: epidural. Postpartum course has been unremarkable except for bladder control. Baby's course has been unremarkable. Baby is feeding by bottle - Similac Advance. Bleeding no bleeding. Bowel function is normal. Bladder function is abnormal: no bladder control. Patient is not sexually active. Contraception method is condoms. Postpartum depression screening:neg  The following portions of the patient's history were reviewed and updated as appropriate: allergies, current medications, past family history, past medical history, past social history, past surgical history and problem list.  Review of Systems Pertinent items are noted in HPI.    Objective:  Blood pressure (!) 111/59, pulse 73, resp. rate 16, height  (1.676 m), weight 132 lb (59.9 kg), last menstrual period 12/15/2016, not currently breastfeeding.  General:  alert   Breasts:  inspection negative, no nipple discharge or bleeding, no masses or nodularity palpable  Lungs: clear to auscultation bilaterally  Heart:  regular rate and rhythm, S1, S2 normal, no murmur, click, rub or gallop  Abdomen: soft, non-tender; bowel sounds normal; no masses,  no organomegaly   Vulva:  granulation tissue at os and on perineum, both areas treated with silver nitrate  Vagina: tears in sulcus still healing  Cervix:  not examined as she did not want a speculum inside  Corpus: not examined  Adnexa:  not evaluated  Rectal Exam: Not performed.        Assessment:    Nomral postpartum exam. Pap smear not done at today's visit.   Plan:   1. Contraception: condoms 2. Appt with Urology made 3. Follow up in: 1 week for repeat treatment of silver nitrate or as needed.

## 2016-12-29 ENCOUNTER — Ambulatory Visit: Payer: BLUE CROSS/BLUE SHIELD | Admitting: Obstetrics & Gynecology

## 2016-12-29 ENCOUNTER — Encounter: Payer: Self-pay | Admitting: Obstetrics & Gynecology

## 2016-12-29 VITALS — BP 110/60 | HR 78 | Resp 16 | Ht 66.0 in | Wt 132.0 lb

## 2016-12-29 DIAGNOSIS — L929 Granulomatous disorder of the skin and subcutaneous tissue, unspecified: Secondary | ICD-10-CM

## 2016-12-29 NOTE — Progress Notes (Signed)
   Subjective:    Patient ID: Brittany Welch, female    DOB: 20-Feb-1986, 31 y.o.   MRN: [id number]  HPI 31yo lady here for a recheck of the granulation tissue at her introitus after her vaginal delivery with 2nd degree ("deep") tear. It is still very painful.   Review of Systems     Objective:   Physical Exam [id number] The granulation tissue is still present, hanging by a thin stalk. I numbed the area with lidocaine jelly and then 1 cc of 1% lidocaine. I snipped off the offending tissue. I cauterized the base with silver nitrate. She tolerated the procedure well.       Assessment & Plan:  Painful granulation tissue RTC 1 week for re check

## 2017-01-06 ENCOUNTER — Ambulatory Visit: Payer: BLUE CROSS/BLUE SHIELD | Admitting: Obstetrics & Gynecology

## 2018-01-18 ENCOUNTER — Ambulatory Visit (INDEPENDENT_AMBULATORY_CARE_PROVIDER_SITE_OTHER): Payer: BLUE CROSS/BLUE SHIELD | Admitting: Obstetrics & Gynecology

## 2018-01-18 ENCOUNTER — Encounter: Payer: Self-pay | Admitting: Obstetrics & Gynecology

## 2018-01-18 VITALS — BP 118/67 | HR 73 | Resp 16 | Ht 65.0 in | Wt 127.0 lb

## 2018-01-18 DIAGNOSIS — O039 Complete or unspecified spontaneous abortion without complication: Secondary | ICD-10-CM | POA: Diagnosis not present

## 2018-01-18 NOTE — Progress Notes (Signed)
LMP 11/06/17.  Started bleeding and cramping

## 2018-01-18 NOTE — Progress Notes (Signed)
   Subjective:    Patient ID: Brittany Welch, female    DOB: Nov 12, 1985, 32 y.o.   MRN: [id number]  HPI  32 yo married P1 here with some spotting in pregnancy. She is supposed to be [redacted] week EGA by her LMP.  Review of Systems Blood type A+    Objective:   Physical Exam  Breathing, conversing, and ambulating normally Well nourished, well hydrated White female, no apparent distress Bedside u/s showed a 8 week size fetus with no fetal heart beat      Assessment & Plan:  Miscarriage- offered watchful waiting, cytotec or d&c.  She isn't sure what she wants today and she will call with her decision.

## 2018-06-21 ENCOUNTER — Encounter: Payer: Self-pay | Admitting: Advanced Practice Midwife

## 2018-06-21 ENCOUNTER — Ambulatory Visit (INDEPENDENT_AMBULATORY_CARE_PROVIDER_SITE_OTHER): Payer: 59 | Admitting: Advanced Practice Midwife

## 2018-06-21 VITALS — BP 101/56 | HR 75 | Wt 131.0 lb

## 2018-06-21 DIAGNOSIS — Z3480 Encounter for supervision of other normal pregnancy, unspecified trimester: Secondary | ICD-10-CM

## 2018-06-21 DIAGNOSIS — Z3A19 19 weeks gestation of pregnancy: Secondary | ICD-10-CM

## 2018-06-21 DIAGNOSIS — Z113 Encounter for screening for infections with a predominantly sexual mode of transmission: Secondary | ICD-10-CM

## 2018-06-21 DIAGNOSIS — Z3482 Encounter for supervision of other normal pregnancy, second trimester: Secondary | ICD-10-CM

## 2018-06-21 NOTE — Progress Notes (Signed)
   PRENATAL VISIT NOTE  Subjective:  Brittany Welch is a 32 y.o. G2P1001 at [redacted]w[redacted]d being seen today for ongoing prenatal care.  She is currently monitored for the following issues for this low-risk pregnancy and has Supervision of other normal pregnancy, antepartum on their problem list.  Patient reports no complaints.   . Vag. Bleeding: None.   . Denies leaking of fluid.   The following portions of the patient's history were reviewed and updated as appropriate: allergies, current medications, past family history, past medical history, past social history, past surgical history and problem list. Problem list updated.  Objective:   Vitals:   06/21/18 1453  BP: (!) 101/56  Pulse: 75  Weight: 59.4 kg    Fetal Status: Fetal Heart Rate (bpm): 144         General:  Alert, oriented and cooperative. Patient is in no acute distress.  Skin: Skin is warm and dry. No rash noted.   Cardiovascular: Normal heart rate noted  Respiratory: Normal respiratory effort, no problems with respiration noted  Abdomen: Soft, gravid, appropriate for gestational age.        Pelvic: Cervical exam deferred        Extremities: Normal range of motion.  Edema: None  Mental Status: Normal mood and affect. Normal behavior. Normal judgment and thought content.   Assessment and Plan:  Pregnancy: G2P1001 at [redacted]w[redacted]d  1. Supervision of other normal pregnancy, antepartum --Discussed and offered genetic screening options, including Quad screen/AFP, NIPS testing, and option to decline testing. Benefits/risks/alternatives reviewed. Pt aware that anatomy US is form of genetic screening with lower accuracy in detecting trisomies than blood work.  Pt declines genetic screening today. --Anticipatory guidance about next visits/weeks of pregnancy given. --Pt declines Pap. Discussed reasons for pap, recommend pap with cotesting at age 40.  Pt declines today. --Prefers female providers, aware that we will try to accommodate outside  of emergencies - Obstetric panel - HIV antibody (with reflex) - GC/Chlamydia probe amp ([place])not at [facility] - Culture, OB Urine - [name] Schedule Optimization  Preterm labor symptoms and general obstetric precautions including but not limited to vaginal bleeding, contractions, leaking of fluid and fetal movement were reviewed in detail with the patient. Please refer to After Visit Summary for other counseling recommendations.  No follow-ups on file.  Future Appointments  Date Time Provider Department Center  07/26/2018  3:45 PM Sharyon Cable, CNM [facility]    Sharen Counter, CNM

## 2018-06-22 LAB — OBSTETRIC PANEL
Antibody Screen: NOT DETECTED
BASOS PCT: 0.3 %
Basophils Absolute: 24 cells/uL (ref 0–200)
Eosinophils Absolute: 47 cells/uL (ref 15–500)
Eosinophils Relative: 0.6 %
HCT: 36.3 % (ref 35.0–45.0)
HEMOGLOBIN: 12.5 g/dL (ref 11.7–15.5)
Hepatitis B Surface Ag: NONREACTIVE
LYMPHS ABS: 1627 {cells}/uL (ref 850–3900)
MCH: 28.5 pg (ref 27.0–33.0)
MCHC: 34.4 g/dL (ref 32.0–36.0)
MCV: 82.9 fL (ref 80.0–100.0)
MPV: 11.7 fL (ref 7.5–12.5)
Monocytes Relative: 4.8 %
Neutro Abs: 5822 cells/uL (ref 1500–7800)
Neutrophils Relative %: 73.7 %
Platelets: 204 10*3/uL (ref 140–400)
RBC: 4.38 10*6/uL (ref 3.80–5.10)
RDW: 13.5 % (ref 11.0–15.0)
RPR Ser Ql: NONREACTIVE
Rubella: 0.99 index — ABNORMAL LOW
Total Lymphocyte: 20.6 %
WBC: 7.9 10*3/uL (ref 3.8–10.8)
WBCMIX: 379 {cells}/uL (ref 200–950)

## 2018-06-22 LAB — HIV ANTIBODY (ROUTINE TESTING W REFLEX): HIV 1&2 Ab, 4th Generation: NONREACTIVE

## 2018-06-23 LAB — GC/CHLAMYDIA PROBE AMP (~~LOC~~) NOT AT ARMC
Chlamydia: NEGATIVE
Neisseria Gonorrhea: NEGATIVE

## 2018-06-24 LAB — CULTURE, OB URINE

## 2018-06-24 LAB — URINE CULTURE, OB REFLEX

## 2018-07-08 ENCOUNTER — Encounter (HOSPITAL_COMMUNITY): Payer: Self-pay

## 2018-07-15 ENCOUNTER — Other Ambulatory Visit: Payer: Self-pay | Admitting: Advanced Practice Midwife

## 2018-07-15 ENCOUNTER — Ambulatory Visit (HOSPITAL_COMMUNITY)
Admission: RE | Admit: 2018-07-15 | Discharge: 2018-07-15 | Disposition: A | Discharge status: Home / Self Care | Payer: 59 | Source: Ambulatory Visit | Attending: Advanced Practice Midwife | Admitting: Advanced Practice Midwife

## 2018-07-15 DIAGNOSIS — Z3A19 19 weeks gestation of pregnancy: Secondary | ICD-10-CM

## 2018-07-15 DIAGNOSIS — Z363 Encounter for antenatal screening for malformations: Secondary | ICD-10-CM

## 2018-07-15 DIAGNOSIS — Z3A18 18 weeks gestation of pregnancy: Secondary | ICD-10-CM | POA: Diagnosis not present

## 2018-07-15 DIAGNOSIS — O359XX0 Maternal care for (suspected) fetal abnormality and damage, unspecified, not applicable or unspecified: Secondary | ICD-10-CM | POA: Diagnosis not present

## 2018-07-15 DIAGNOSIS — Z3480 Encounter for supervision of other normal pregnancy, unspecified trimester: Secondary | ICD-10-CM

## 2018-07-15 DIAGNOSIS — Z3482 Encounter for supervision of other normal pregnancy, second trimester: Secondary | ICD-10-CM | POA: Diagnosis not present

## 2018-07-18 ENCOUNTER — Other Ambulatory Visit (HOSPITAL_COMMUNITY): Payer: Self-pay | Admitting: *Deleted

## 2018-07-18 DIAGNOSIS — O283 Abnormal ultrasonic finding on antenatal screening of mother: Secondary | ICD-10-CM

## 2018-07-26 ENCOUNTER — Encounter: Payer: Self-pay | Admitting: Certified Nurse Midwife

## 2018-07-26 ENCOUNTER — Ambulatory Visit (INDEPENDENT_AMBULATORY_CARE_PROVIDER_SITE_OTHER): Payer: 59 | Admitting: Certified Nurse Midwife

## 2018-07-26 DIAGNOSIS — Z3480 Encounter for supervision of other normal pregnancy, unspecified trimester: Secondary | ICD-10-CM

## 2018-07-26 DIAGNOSIS — Z3482 Encounter for supervision of other normal pregnancy, second trimester: Secondary | ICD-10-CM

## 2018-07-26 DIAGNOSIS — O283 Abnormal ultrasonic finding on antenatal screening of mother: Secondary | ICD-10-CM | POA: Insufficient documentation

## 2018-07-26 NOTE — Patient Instructions (Signed)

## 2018-07-26 NOTE — Progress Notes (Signed)
   PRENATAL VISIT NOTE  Subjective:  Brittany Welch is a 32 y.o. G2P1001 at 103w3d being seen today for ongoing prenatal care.  She is currently monitored for the following issues for this low-risk pregnancy and has Supervision of other normal pregnancy, antepartum and Nuchal fold thickening on prenatal ultrasound on their problem list.  Patient reports no complaints.  Contractions: Not present. Vag. Bleeding: None.  Movement: Present. Denies leaking of fluid.   The following portions of the patient's history were reviewed and updated as appropriate: allergies, current medications, past family history, past medical history, past social history, past surgical history and problem list. Problem list updated.  Objective:   Vitals:   07/26/18 1537  BP: 105/64  Pulse: 89  Weight: 137 lb (62.1 kg)    Fetal Status: Fetal Heart Rate (bpm): 157 Fundal Height: 20 cm Movement: Present     General:  Alert, oriented and cooperative. Patient is in no acute distress.  Skin: Skin is warm and dry. No rash noted.   Cardiovascular: Normal heart rate noted  Respiratory: Normal respiratory effort, no problems with respiration noted  Abdomen: Soft, gravid, appropriate for gestational age.  Pain/Pressure: Absent     Pelvic: Cervical exam deferred        Extremities: Normal range of motion.  Edema: None  Mental Status: Normal mood and affect. Normal behavior. Normal judgment and thought content.   Assessment and Plan:  Pregnancy: G2P1001 at 423w3d  1. Supervision of other normal pregnancy, antepartum - Patient doing well, no complaints  - Anticipatory guidance on upcoming appointments  - Reviewed Anatomy US with patient and answered questions, patient declines genetic screening   2. Nuchal fold thickening on prenatal ultrasound - Thickened nuchal fold noted on anatomy US on 11/01- patient declined additional genetic screening offered by MFM  - Repeat US scheduled for 09/21/17   Preterm labor symptoms  and general obstetric precautions including but not limited to vaginal bleeding, contractions, leaking of fluid and fetal movement were reviewed in detail with the patient. Please refer to After Visit Summary for other counseling recommendations.  Return in about 8 weeks (around 09/20/2018) for ROB/2hrGTT.  Future Appointments  Date Time Provider Department Center  09/20/2018  8:15 AM Sharyon CableRogers, Sumi Lye C, [facility]  09/21/2018  3:30 PM [facility] US 1 [facility]    Sharyon CableVeronica C Kevia Zaucha, CNM

## 2018-09-09 ENCOUNTER — Ambulatory Visit (INDEPENDENT_AMBULATORY_CARE_PROVIDER_SITE_OTHER): Payer: 59 | Admitting: Certified Nurse Midwife

## 2018-09-09 VITALS — BP 108/59 | HR 96 | Wt 138.0 lb

## 2018-09-09 DIAGNOSIS — Z3480 Encounter for supervision of other normal pregnancy, unspecified trimester: Secondary | ICD-10-CM

## 2018-09-09 DIAGNOSIS — Z3483 Encounter for supervision of other normal pregnancy, third trimester: Secondary | ICD-10-CM

## 2018-09-09 NOTE — Progress Notes (Signed)
Subjective:  Lenox PondsKristen B Rotz is a 32 y.o. G2P1001 at 350w6d being seen today for ongoing prenatal care.  She is currently monitored for the following issues for this low-risk pregnancy and has Supervision of other normal pregnancy, antepartum and Nuchal fold thickening on prenatal ultrasound on their problem list.  Patient reports no complaints.  Contractions: Not present. Vag. Bleeding: None.  Movement: Present. Denies leaking of fluid.   The following portions of the patient's history were reviewed and updated as appropriate: allergies, current medications, past family history, past medical history, past social history, past surgical history and problem list. Problem list updated.  Objective:   Vitals:   09/09/18 0828  BP: (!) 108/59  Pulse: 96  Weight: 62.6 kg    Fetal Status: Fetal Heart Rate (bpm): 151 Fundal Height: 26 cm Movement: Present  Presentation: Transverse  General:  Alert, oriented and cooperative. Patient is in no acute distress.  Skin: Skin is warm and dry. No rash noted.   Cardiovascular: Normal heart rate noted  Respiratory: Normal respiratory effort, no problems with respiration noted  Abdomen: Soft, gravid, appropriate for gestational age. Pain/Pressure: Absent     Pelvic: Vag. Bleeding: None Vag D/C Character: Thin   Cervical exam deferred        Extremities: Normal range of motion.  Edema: None  Mental Status: Normal mood and affect. Normal behavior. Normal judgment and thought content.   Urinalysis:      Assessment and Plan:  Pregnancy: G2P1001 at 2350w6d  1. Supervision of other normal pregnancy, antepartum - 2Hr GTT w/ 1 Hr Carpenter 75 g - HIV antibody (with reflex) - RPR - CBC  2. Increased thickening of nuchal fold - Follow up US scheduled - declined genetic screening  Preterm labor symptoms and general obstetric precautions including but not limited to vaginal bleeding, contractions, leaking of fluid and fetal movement were reviewed in detail  with the patient. Please refer to After Visit Summary for other counseling recommendations.  Return in about 2 weeks (around 09/23/2018).   Donette LarryBhambri, Pankaj Haack, CNM

## 2018-09-09 NOTE — Progress Notes (Signed)
PT declines tdap

## 2018-09-12 LAB — 2HR GTT W 1 HR, CARPENTER, 75 G
GLUCOSE, 1 HR, GEST: 120 mg/dL (ref 65–179)
GLUCOSE, 2 HR, GEST: 104 mg/dL (ref 65–152)
Glucose, Fasting, Gest: 81 mg/dL (ref 65–91)

## 2018-09-12 LAB — CBC
HEMATOCRIT: 35.3 % (ref 35.0–45.0)
Hemoglobin: 12.1 g/dL (ref 11.7–15.5)
MCH: 29.1 pg (ref 27.0–33.0)
MCHC: 34.3 g/dL (ref 32.0–36.0)
MCV: 84.9 fL (ref 80.0–100.0)
MPV: 11.9 fL (ref 7.5–12.5)
PLATELETS: 189 10*3/uL (ref 140–400)
RBC: 4.16 10*6/uL (ref 3.80–5.10)
RDW: 13.1 % (ref 11.0–15.0)
WBC: 11.1 10*3/uL — AB (ref 3.8–10.8)

## 2018-09-12 LAB — HIV ANTIBODY (ROUTINE TESTING W REFLEX): HIV: NONREACTIVE

## 2018-09-12 LAB — RPR: RPR: NONREACTIVE

## 2018-09-14 NOTE — L&D Delivery Note (Signed)
Brittany Welch is a 33 y.o. female G2P1001 with IUP at [redacted]w[redacted]d admitted for IOL for postdates.  She progressed with Pitocin and AROM augmentation to complete and pushed 1x to deliver.  Cord clamping delayed by several minutes then clamped by CNM and cut by FOB.    Delivery Note At 3:41 PM a viable female was delivered via Vaginal, Spontaneous (Presentation: ROA).  APGAR: 8, 9; weight pending.   Placenta status: spontaneous, intact.  Cord: 3 vessels  Anesthesia:  Local lidocaine Episiotomy: None Lacerations: 1st degree Suture Repair: 3.0 vicryl Est. Blood Loss (mL): 50  Mom to postpartum.  Baby to Couplet care / Skin to Skin.  Rolm Bookbinder CNM 12/17/2018, 4:24 PM

## 2018-09-20 ENCOUNTER — Encounter: Payer: 59 | Admitting: Certified Nurse Midwife

## 2018-09-21 ENCOUNTER — Encounter (HOSPITAL_COMMUNITY): Payer: Self-pay

## 2018-09-21 ENCOUNTER — Ambulatory Visit (HOSPITAL_COMMUNITY): Payer: 59

## 2018-10-14 ENCOUNTER — Ambulatory Visit (INDEPENDENT_AMBULATORY_CARE_PROVIDER_SITE_OTHER): Payer: 59

## 2018-10-14 VITALS — BP 114/67 | HR 98 | Wt 146.0 lb

## 2018-10-14 DIAGNOSIS — Z3483 Encounter for supervision of other normal pregnancy, third trimester: Secondary | ICD-10-CM

## 2018-10-14 DIAGNOSIS — Z3480 Encounter for supervision of other normal pregnancy, unspecified trimester: Secondary | ICD-10-CM

## 2018-10-14 NOTE — Progress Notes (Signed)
   PRENATAL VISIT NOTE  Subjective:  Ellah Deblanc Everitt is a 33 y.o. G2P1001 at [redacted]w[redacted]d being seen today for ongoing prenatal care.  She is currently monitored for the following issues for this low-risk pregnancy and has Supervision of other normal pregnancy, antepartum and Nuchal fold thickening on prenatal ultrasound on their problem list.  Patient reports no complaints.  Contractions: Not present. Vag. Bleeding: None.  Movement: Present. Denies leaking of fluid.   The following portions of the patient's history were reviewed and updated as appropriate: allergies, current medications, past family history, past medical history, past social history, past surgical history and problem list. Problem list updated.  Objective:   Vitals:   10/14/18 1003  BP: 114/67  Pulse: 98  Weight: 146 lb (66.2 kg)    Fetal Status: Fetal Heart Rate (bpm): 153   Movement: Present     Fundal Height: 32cm  General:  Alert, oriented and cooperative. Patient is in no acute distress.  Skin: Skin is warm and dry. No rash noted.   Cardiovascular: Normal heart rate noted  Respiratory: Normal respiratory effort, no problems with respiration noted  Abdomen: Soft, gravid, appropriate for gestational age.  Pain/Pressure: Absent     Pelvic: Cervical exam deferred        Extremities: Normal range of motion.  Edema: None  Mental Status: Normal mood and affect. Normal behavior. Normal judgment and thought content.   Assessment and Plan:  Pregnancy: G2P1001 at [redacted]w[redacted]d  1. Supervision of other normal pregnancy, antepartum Doing well today with no complaints.  Anticipatory guidance provided.  F/U in 4 weeks.  Preterm labor symptoms and general obstetric precautions including but not limited to vaginal bleeding, contractions, leaking of fluid and fetal movement were reviewed in detail with the patient. Please refer to After Visit Summary for other counseling recommendations.  Return in about 4 weeks (around 11/11/2018) for  Return OB visit.  No future appointments.  Louis Matte Earlene Plater, SNP

## 2018-10-14 NOTE — Patient Instructions (Signed)

## 2018-11-11 ENCOUNTER — Ambulatory Visit (INDEPENDENT_AMBULATORY_CARE_PROVIDER_SITE_OTHER): Payer: 59

## 2018-11-11 VITALS — BP 107/62 | HR 98 | Wt 148.0 lb

## 2018-11-11 DIAGNOSIS — Z3480 Encounter for supervision of other normal pregnancy, unspecified trimester: Secondary | ICD-10-CM

## 2018-11-11 DIAGNOSIS — Z113 Encounter for screening for infections with a predominantly sexual mode of transmission: Secondary | ICD-10-CM

## 2018-11-11 DIAGNOSIS — Z3483 Encounter for supervision of other normal pregnancy, third trimester: Secondary | ICD-10-CM

## 2018-11-11 DIAGNOSIS — Z3A35 35 weeks gestation of pregnancy: Secondary | ICD-10-CM

## 2018-11-11 NOTE — Progress Notes (Addendum)
   PRENATAL VISIT NOTE  Subjective:  Brittany Welch is a 33 y.o. G2P1001 at [redacted]w[redacted]d being seen today for ongoing prenatal care.  She is currently monitored for the following issues for this low-risk pregnancy and has Supervision of other normal pregnancy, antepartum and Nuchal fold thickening on prenatal ultrasound on their problem list.  Patient reports no complaints.  Contractions: Not present. Vag. Bleeding: None.  Movement: Present. Denies leaking of fluid.   The following portions of the patient's history were reviewed and updated as appropriate: allergies, current medications, past family history, past medical history, past social history, past surgical history and problem list. Problem list updated.  Objective:   Vitals:   11/11/18 1027  BP: 107/62  Pulse: 98  Weight: 148 lb (67.1 kg)    Fetal Status: Fetal Heart Rate (bpm): 140 Fundal Height: 36 cm Movement: Present     General:  Alert, oriented and cooperative. Patient is in no acute distress.  Skin: Skin is warm and dry. No rash noted.   Cardiovascular: Normal heart rate noted  Respiratory: Normal respiratory effort, no problems with respiration noted  Abdomen: Soft, gravid, appropriate for gestational age.  Pain/Pressure: Absent     Pelvic: Cervical exam deferred        Extremities: Normal range of motion.  Edema: None  Mental Status: Normal mood and affect. Normal behavior. Normal judgment and thought content.   Assessment and Plan:  Pregnancy: G2P1001 at [redacted]w[redacted]d  1. Supervision of other normal pregnancy, antepartum GBS self-collected by patient today per her request.  Routine prenatal care.  Anticipatory guidance given.  [name] patient.  F/U in 2 weeks. - Culture, beta strep (group b only) - Cervicovaginal ancillary only( [place])  Preterm labor symptoms and general obstetric precautions including but not limited to vaginal bleeding, contractions, leaking of fluid and fetal movement were reviewed in detail  with the patient. Please refer to After Visit Summary for other counseling recommendations.  No follow-ups on file.  Future Appointments  Date Time Provider Department Center  11/25/2018 10:30 AM Brittany Welch, CNM [facility]    Louis Matte. Earlene Plater, SNP

## 2018-11-14 LAB — CULTURE, BETA STREP (GROUP B ONLY)
MICRO NUMBER:: 256552
SPECIMEN QUALITY:: ADEQUATE

## 2018-11-14 LAB — CERVICOVAGINAL ANCILLARY ONLY
CHLAMYDIA, DNA PROBE: NEGATIVE
NEISSERIA GONORRHEA: NEGATIVE

## 2018-11-25 ENCOUNTER — Ambulatory Visit (INDEPENDENT_AMBULATORY_CARE_PROVIDER_SITE_OTHER): Payer: 59

## 2018-11-25 ENCOUNTER — Other Ambulatory Visit: Payer: Self-pay

## 2018-11-25 VITALS — BP 114/77 | HR 104 | Wt 147.0 lb

## 2018-11-25 DIAGNOSIS — Z3A37 37 weeks gestation of pregnancy: Secondary | ICD-10-CM

## 2018-11-25 DIAGNOSIS — Z3480 Encounter for supervision of other normal pregnancy, unspecified trimester: Secondary | ICD-10-CM

## 2018-11-25 DIAGNOSIS — Z3483 Encounter for supervision of other normal pregnancy, third trimester: Secondary | ICD-10-CM

## 2018-11-25 NOTE — Progress Notes (Signed)
   PRENATAL VISIT NOTE  Subjective:  Brittany Welch is a 33 y.o. G2P1001 at [redacted]w[redacted]d being seen today for ongoing prenatal care.  She is currently monitored for the following issues for this low-risk pregnancy and has Supervision of other normal pregnancy, antepartum and Nuchal fold thickening on prenatal ultrasound on their problem list.  Patient reports no complaints.  Contractions: Not present. Vag. Bleeding: None.  Movement: Present. Denies leaking of fluid.   The following portions of the patient's history were reviewed and updated as appropriate: allergies, current medications, past family history, past medical history, past social history, past surgical history and problem list.   Objective:   Vitals:   11/25/18 1034  BP: 114/77  Pulse: (!) 104  Weight: 147 lb (66.7 kg)    Fetal Status: Fetal Heart Rate (bpm): 138 Fundal Height: 39 cm Movement: Present     General:  Alert, oriented and cooperative. Patient is in no acute distress.  Skin: Skin is warm and dry. No rash noted.   Cardiovascular: Normal heart rate noted  Respiratory: Normal respiratory effort, no problems with respiration noted  Abdomen: Soft, gravid, appropriate for gestational age.  Pain/Pressure: Absent     Pelvic: Cervical exam deferred        Extremities: Normal range of motion.  Edema: None  Mental Status: Normal mood and affect. Normal behavior. Normal judgment and thought content.   Assessment and Plan:  Pregnancy: G2P1001 at [redacted]w[redacted]d 1. Supervision of other normal pregnancy, antepartum - no complaints. Routine care  Term labor symptoms and general obstetric precautions including but not limited to vaginal bleeding, contractions, leaking of fluid and fetal movement were reviewed in detail with the patient. Please refer to After Visit Summary for other counseling recommendations.   Future Appointments  Date Time Provider Department Center  12/02/2018 10:15 AM Donette Larry, CNM [facility]    Rolm Bookbinder, [place] 11/25/18 11:27 AM

## 2018-12-02 ENCOUNTER — Other Ambulatory Visit: Payer: Self-pay

## 2018-12-02 ENCOUNTER — Ambulatory Visit (INDEPENDENT_AMBULATORY_CARE_PROVIDER_SITE_OTHER): Payer: 59 | Admitting: Certified Nurse Midwife

## 2018-12-02 VITALS — BP 120/88 | HR 92 | Wt 147.0 lb

## 2018-12-02 DIAGNOSIS — Z3483 Encounter for supervision of other normal pregnancy, third trimester: Secondary | ICD-10-CM

## 2018-12-02 DIAGNOSIS — Z3480 Encounter for supervision of other normal pregnancy, unspecified trimester: Secondary | ICD-10-CM

## 2018-12-02 DIAGNOSIS — Z3A38 38 weeks gestation of pregnancy: Secondary | ICD-10-CM

## 2018-12-02 NOTE — Progress Notes (Signed)
Subjective:  Brittany Welch is a 33 y.o. G2P1001 at [redacted]w[redacted]d being seen today for ongoing prenatal care.  She is currently monitored for the following issues for this low-risk pregnancy and has Supervision of other normal pregnancy, antepartum and Nuchal fold thickening on prenatal ultrasound on their problem list.  Patient reports no complaints.  Contractions: Not present. Vag. Bleeding: None.  Movement: Present. Denies leaking of fluid.   The following portions of the patient's history were reviewed and updated as appropriate: allergies, current medications, past family history, past medical history, past social history, past surgical history and problem list. Problem list updated.  Objective:   Vitals:   12/02/18 1014  BP: 120/88  Pulse: 92  Weight: 66.7 kg    Fetal Status: Fetal Heart Rate (bpm): 147 Fundal Height: 38 cm Movement: Present  Presentation: Vertex  General:  Alert, oriented and cooperative. Patient is in no acute distress.  Skin: Skin is warm and dry. No rash noted.   Cardiovascular: Normal heart rate noted  Respiratory: Normal respiratory effort, no problems with respiration noted  Abdomen: Soft, gravid, appropriate for gestational age. Pain/Pressure: Absent     Pelvic: Vag. Bleeding: None Vag D/C Character: Thin   Cervical exam deferred        Extremities: Normal range of motion.  Edema: None  Mental Status: Normal mood and affect. Normal behavior. Normal judgment and thought content.   Urinalysis:      Assessment and Plan:  Pregnancy: G2P1001 at [redacted]w[redacted]d  1. Supervision of other normal pregnancy, antepartum - NST/AFI next week - IOL at 41 wks  Term labor symptoms and general obstetric precautions including but not limited to vaginal bleeding, contractions, leaking of fluid and fetal movement were reviewed in detail with the patient. Please refer to After Visit Summary for other counseling recommendations.  Return in about 1 week (around 12/09/2018) for ROB with  NST/AFI.   Donette Larry, CNM

## 2018-12-09 ENCOUNTER — Ambulatory Visit (INDEPENDENT_AMBULATORY_CARE_PROVIDER_SITE_OTHER): Payer: 59

## 2018-12-09 ENCOUNTER — Other Ambulatory Visit: Payer: Self-pay

## 2018-12-09 VITALS — BP 118/58 | HR 73 | Wt 149.0 lb

## 2018-12-09 DIAGNOSIS — Z3483 Encounter for supervision of other normal pregnancy, third trimester: Secondary | ICD-10-CM | POA: Diagnosis not present

## 2018-12-09 DIAGNOSIS — Z3A39 39 weeks gestation of pregnancy: Secondary | ICD-10-CM

## 2018-12-09 DIAGNOSIS — Z3480 Encounter for supervision of other normal pregnancy, unspecified trimester: Secondary | ICD-10-CM

## 2018-12-09 NOTE — Progress Notes (Addendum)
   PRENATAL VISIT NOTE  Subjective:  Brittany Welch is a 33 y.o. G2P1001 at [redacted]w[redacted]d being seen today for ongoing prenatal care.  She is currently monitored for the following issues for this high-risk pregnancy and has Supervision of other normal pregnancy, antepartum and Nuchal fold thickening on prenatal ultrasound on their problem list.  Patient reports no complaints.  Contractions: Not present. Vag. Bleeding: None.  Movement: Present. Denies leaking of fluid.   The following portions of the patient's history were reviewed and updated as appropriate: allergies, current medications, past family history, past medical history, past social history, past surgical history and problem list.   Objective:   Vitals:   12/09/18 1032  BP: (!) 118/58  Pulse: 73  Weight: 149 lb (67.6 kg)    Fetal Status: Fetal Heart Rate (bpm): 140 Fundal Height: 40 cm Movement: Present  Presentation: Vertex  General:  Alert, oriented and cooperative. Patient is in no acute distress.  Skin: Skin is warm and dry. No rash noted.   Cardiovascular: Normal heart rate noted  Respiratory: Normal respiratory effort, no problems with respiration noted  Abdomen: Soft, gravid, appropriate for gestational age.  Pain/Pressure: Absent     Pelvic: Cervical exam deferred        Extremities: Normal range of motion.  Edema: None  Mental Status: Normal mood and affect. Normal behavior. Normal judgment and thought content.   NST reactive: 140 bpm/moderate variability/15x15 accels/no decels AFI 16cm  Assessment and Plan:  Pregnancy: G2P1001 at [redacted]w[redacted]d 1. Supervision of other normal pregnancy, antepartum - No complaints. Routine care - IOL 4/4 at 0730, orders placed - Will do 2x weekly testing next week for postdates  Term labor symptoms and general obstetric precautions including but not limited to vaginal bleeding, contractions, leaking of fluid and fetal movement were reviewed in detail with the patient. Please refer to After  Visit Summary for other counseling recommendations.   Return in about 1 week (around 12/16/2018) for Return OB visit.  Future Appointments  Date Time Provider Department Center  12/17/2018  7:30 AM [facility] ROOM [facility] None    Rolm Bookbinder, [place] 12/09/18 11:18 AM

## 2018-12-10 NOTE — Addendum Note (Signed)
Addended by: Rolm Bookbinder on: 12/10/2018 08:02 AM   Modules accepted: Orders, SmartSet

## 2018-12-11 ENCOUNTER — Other Ambulatory Visit: Payer: Self-pay | Admitting: Family Medicine

## 2018-12-13 ENCOUNTER — Ambulatory Visit (INDEPENDENT_AMBULATORY_CARE_PROVIDER_SITE_OTHER): Payer: 59 | Admitting: *Deleted

## 2018-12-13 ENCOUNTER — Other Ambulatory Visit: Payer: Self-pay

## 2018-12-13 ENCOUNTER — Telehealth (HOSPITAL_COMMUNITY): Payer: Self-pay | Admitting: *Deleted

## 2018-12-13 VITALS — BP 112/55 | HR 60 | Wt 147.0 lb

## 2018-12-13 DIAGNOSIS — Z3A40 40 weeks gestation of pregnancy: Secondary | ICD-10-CM

## 2018-12-13 DIAGNOSIS — O480 Post-term pregnancy: Secondary | ICD-10-CM | POA: Diagnosis not present

## 2018-12-13 NOTE — Telephone Encounter (Signed)
Preadmission screen  

## 2018-12-16 ENCOUNTER — Telehealth (HOSPITAL_COMMUNITY): Payer: Self-pay | Admitting: *Deleted

## 2018-12-16 ENCOUNTER — Encounter (HOSPITAL_COMMUNITY): Payer: Self-pay | Admitting: *Deleted

## 2018-12-16 ENCOUNTER — Ambulatory Visit (INDEPENDENT_AMBULATORY_CARE_PROVIDER_SITE_OTHER): Payer: 59 | Admitting: Certified Nurse Midwife

## 2018-12-16 ENCOUNTER — Other Ambulatory Visit: Payer: Self-pay

## 2018-12-16 ENCOUNTER — Other Ambulatory Visit (HOSPITAL_COMMUNITY): Payer: Self-pay | Admitting: *Deleted

## 2018-12-16 VITALS — BP 115/65 | HR 77 | Wt 148.0 lb

## 2018-12-16 DIAGNOSIS — Z3480 Encounter for supervision of other normal pregnancy, unspecified trimester: Secondary | ICD-10-CM

## 2018-12-16 DIAGNOSIS — Z3A40 40 weeks gestation of pregnancy: Secondary | ICD-10-CM

## 2018-12-16 DIAGNOSIS — O283 Abnormal ultrasonic finding on antenatal screening of mother: Secondary | ICD-10-CM | POA: Diagnosis not present

## 2018-12-16 DIAGNOSIS — O480 Post-term pregnancy: Secondary | ICD-10-CM

## 2018-12-16 NOTE — Telephone Encounter (Signed)
Preadmission screen  

## 2018-12-16 NOTE — Progress Notes (Signed)
Subjective:  Brittany Welch is a 33 y.o. G2P1001 at [redacted]w[redacted]d being seen today for ongoing prenatal care.  She is currently monitored for the following issues for this low-risk pregnancy and has Supervision of other normal pregnancy, antepartum and Nuchal fold thickening on prenatal ultrasound on their problem list.  Patient reports no complaints.  Contractions: Not present. Vag. Bleeding: None.  Movement: Present. Denies leaking of fluid.   The following portions of the patient's history were reviewed and updated as appropriate: allergies, current medications, past family history, past medical history, past social history, past surgical history and problem list. Problem list updated.  Objective:   Vitals:   12/16/18 1016  BP: 115/65  Pulse: 77  Weight: 67.1 kg    Fetal Status: Fetal Heart Rate (bpm): NST-R   Movement: Present     General:  Alert, oriented and cooperative. Patient is in no acute distress.  Skin: Skin is warm and dry. No rash noted.   Cardiovascular: Normal heart rate noted  Respiratory: Normal respiratory effort, no problems with respiration noted  Abdomen: Soft, gravid, appropriate for gestational age. Pain/Pressure: Present     Pelvic: Vag. Bleeding: None Vag D/C Character: Thin   Cervical exam deferred        Extremities: Normal range of motion.  Edema: Trace  Mental Status: Normal mood and affect. Normal behavior. Normal judgment and thought content.   Urinalysis:      Assessment and Plan:  Pregnancy: G2P1001 at [redacted]w[redacted]d  1. Supervision of other normal pregnancy, antepartum  2. Post-term pregnancy, 40-42 weeks of gestation - NST reactive - IOL tomorrow - cervical exam deferred d/t pt request  Term labor symptoms and general obstetric precautions including but not limited to vaginal bleeding, contractions, leaking of fluid and fetal movement were reviewed in detail with the patient. Please refer to After Visit Summary for other counseling recommendations.   Return in about 1 day (around 12/17/2018).   Donette Larry, CNM

## 2018-12-17 ENCOUNTER — Inpatient Hospital Stay (HOSPITAL_COMMUNITY)
Admission: AD | Admit: 2018-12-17 | Discharge: 2018-12-18 | DRG: 807 | Disposition: A | Discharge status: Home / Self Care | Payer: 59 | Attending: Obstetrics and Gynecology | Admitting: Obstetrics and Gynecology

## 2018-12-17 ENCOUNTER — Other Ambulatory Visit: Payer: Self-pay

## 2018-12-17 ENCOUNTER — Inpatient Hospital Stay (HOSPITAL_COMMUNITY): Payer: 59

## 2018-12-17 ENCOUNTER — Encounter (HOSPITAL_COMMUNITY): Payer: Self-pay | Admitting: General Practice

## 2018-12-17 ENCOUNTER — Inpatient Hospital Stay (HOSPITAL_COMMUNITY): Payer: 59 | Admitting: Anesthesiology

## 2018-12-17 DIAGNOSIS — O480 Post-term pregnancy: Secondary | ICD-10-CM

## 2018-12-17 DIAGNOSIS — O283 Abnormal ultrasonic finding on antenatal screening of mother: Secondary | ICD-10-CM | POA: Diagnosis present

## 2018-12-17 DIAGNOSIS — Z3480 Encounter for supervision of other normal pregnancy, unspecified trimester: Secondary | ICD-10-CM

## 2018-12-17 DIAGNOSIS — Z3A41 41 weeks gestation of pregnancy: Secondary | ICD-10-CM

## 2018-12-17 DIAGNOSIS — Z3490 Encounter for supervision of normal pregnancy, unspecified, unspecified trimester: Secondary | ICD-10-CM | POA: Diagnosis present

## 2018-12-17 LAB — CBC
HCT: 38.5 % (ref 36.0–46.0)
Hemoglobin: 12 g/dL (ref 12.0–15.0)
MCH: 25.6 pg — ABNORMAL LOW (ref 26.0–34.0)
MCHC: 31.2 g/dL (ref 30.0–36.0)
MCV: 82.3 fL (ref 80.0–100.0)
Platelets: 197 10*3/uL (ref 150–400)
RBC: 4.68 MIL/uL (ref 3.87–5.11)
RDW: 17.8 % — ABNORMAL HIGH (ref 11.5–15.5)
WBC: 12.6 10*3/uL — ABNORMAL HIGH (ref 4.0–10.5)
nRBC: 0.2 % (ref 0.0–0.2)

## 2018-12-17 LAB — TYPE AND SCREEN
ABO/RH(D): A POS
Antibody Screen: NEGATIVE

## 2018-12-17 LAB — ABO/RH: ABO/RH(D): A POS

## 2018-12-17 MED ORDER — LACTATED RINGERS IV SOLN
500.0000 mL | Freq: Once | INTRAVENOUS | Status: AC
  Administered 2018-12-17: 15:00:00 500 mL via INTRAVENOUS

## 2018-12-17 MED ORDER — FLEET ENEMA 7-19 GM/118ML RE ENEM: 1.0000 | ENEMA | RECTAL | Status: DC | PRN

## 2018-12-17 MED ORDER — OXYCODONE-ACETAMINOPHEN 5-325 MG PO TABS: 2.0000 | ORAL_TABLET | ORAL | Status: DC | PRN

## 2018-12-17 MED ORDER — DIPHENHYDRAMINE HCL 50 MG/ML IJ SOLN: 12.5000 mg | INTRAMUSCULAR | Status: DC | PRN

## 2018-12-17 MED ORDER — LIDOCAINE HCL (PF) 1 % IJ SOLN
30.0000 mL | INTRAMUSCULAR | Status: AC | PRN
  Administered 2018-12-17: 16:00:00 30 mL via SUBCUTANEOUS
  Filled 2018-12-17: qty 30

## 2018-12-17 MED ORDER — ACETAMINOPHEN 325 MG PO TABS: 650.0000 mg | ORAL_TABLET | ORAL | Status: DC | PRN

## 2018-12-17 MED ORDER — PHENYLEPHRINE 40 MCG/ML (10ML) SYRINGE FOR IV PUSH (FOR BLOOD PRESSURE SUPPORT): 80.0000 ug | PREFILLED_SYRINGE | INTRAVENOUS | Status: DC | PRN

## 2018-12-17 MED ORDER — EPHEDRINE 5 MG/ML INJ: 10.0000 mg | INTRAVENOUS | Status: DC | PRN

## 2018-12-17 MED ORDER — OXYCODONE-ACETAMINOPHEN 5-325 MG PO TABS: 1.0000 | ORAL_TABLET | ORAL | Status: DC | PRN

## 2018-12-17 MED ORDER — LACTATED RINGERS IV SOLN: 500.0000 mL | INTRAVENOUS | Status: DC | PRN

## 2018-12-17 MED ORDER — FENTANYL-BUPIVACAINE-NACL 0.5-0.125-0.9 MG/250ML-% EP SOLN
EPIDURAL | Status: AC
  Filled 2018-12-17: qty 250

## 2018-12-17 MED ORDER — PRENATAL MULTIVITAMIN CH
1.0000 | ORAL_TABLET | Freq: Every day | ORAL | Status: DC
  Administered 2018-12-18: 1 via ORAL
  Filled 2018-12-17: qty 1

## 2018-12-17 MED ORDER — WITCH HAZEL-GLYCERIN EX PADS: 1.0000 "application " | MEDICATED_PAD | CUTANEOUS | Status: DC | PRN

## 2018-12-17 MED ORDER — FENTANYL CITRATE (PF) 100 MCG/2ML IJ SOLN: 100.0000 ug | INTRAMUSCULAR | Status: DC | PRN

## 2018-12-17 MED ORDER — LACTATED RINGERS IV SOLN
INTRAVENOUS | Status: DC
  Administered 2018-12-17 (×2): via INTRAVENOUS

## 2018-12-17 MED ORDER — SOD CITRATE-CITRIC ACID 500-334 MG/5ML PO SOLN: 30.0000 mL | ORAL | Status: DC | PRN

## 2018-12-17 MED ORDER — SENNOSIDES-DOCUSATE SODIUM 8.6-50 MG PO TABS
2.0000 | ORAL_TABLET | ORAL | Status: DC
  Administered 2018-12-17: 2 via ORAL
  Filled 2018-12-17: qty 2

## 2018-12-17 MED ORDER — BENZOCAINE-MENTHOL 20-0.5 % EX AERO
1.0000 "application " | INHALATION_SPRAY | CUTANEOUS | Status: DC | PRN
  Administered 2018-12-17: 1 via TOPICAL
  Filled 2018-12-17: qty 56

## 2018-12-17 MED ORDER — OXYTOCIN 40 UNITS IN NORMAL SALINE INFUSION - SIMPLE MED
1.0000 m[IU]/min | INTRAVENOUS | Status: DC
  Administered 2018-12-17: 2 m[IU]/min via INTRAVENOUS

## 2018-12-17 MED ORDER — OXYTOCIN BOLUS FROM INFUSION
500.0000 mL | Freq: Once | INTRAVENOUS | Status: AC
  Administered 2018-12-17: 16:00:00 500 mL via INTRAVENOUS

## 2018-12-17 MED ORDER — FENTANYL-BUPIVACAINE-NACL 0.5-0.125-0.9 MG/250ML-% EP SOLN: 12.0000 mL/h | EPIDURAL | Status: DC | PRN

## 2018-12-17 MED ORDER — TERBUTALINE SULFATE 1 MG/ML IJ SOLN: 0.2500 mg | Freq: Once | INTRAMUSCULAR | Status: DC | PRN

## 2018-12-17 MED ORDER — DIPHENHYDRAMINE HCL 25 MG PO CAPS: 25.0000 mg | ORAL_CAPSULE | Freq: Four times a day (QID) | ORAL | Status: DC | PRN

## 2018-12-17 MED ORDER — TETANUS-DIPHTH-ACELL PERTUSSIS 5-2.5-18.5 LF-MCG/0.5 IM SUSP: 0.5000 mL | Freq: Once | INTRAMUSCULAR | Status: DC

## 2018-12-17 MED ORDER — IBUPROFEN 600 MG PO TABS
600.0000 mg | ORAL_TABLET | Freq: Four times a day (QID) | ORAL | Status: DC
  Administered 2018-12-17 – 2018-12-18 (×4): 600 mg via ORAL
  Filled 2018-12-17 (×5): qty 1

## 2018-12-17 MED ORDER — ONDANSETRON HCL 4 MG/2ML IJ SOLN: 4.0000 mg | Freq: Four times a day (QID) | INTRAMUSCULAR | Status: DC | PRN

## 2018-12-17 MED ORDER — ONDANSETRON HCL 4 MG/2ML IJ SOLN: 4.0000 mg | INTRAMUSCULAR | Status: DC | PRN

## 2018-12-17 MED ORDER — COCONUT OIL OIL
1.0000 "application " | TOPICAL_OIL | Status: DC | PRN
  Administered 2018-12-18: 1 via TOPICAL

## 2018-12-17 MED ORDER — ZOLPIDEM TARTRATE 5 MG PO TABS: 5.0000 mg | ORAL_TABLET | Freq: Every evening | ORAL | Status: DC | PRN

## 2018-12-17 MED ORDER — SIMETHICONE 80 MG PO CHEW: 80.0000 mg | CHEWABLE_TABLET | ORAL | Status: DC | PRN

## 2018-12-17 MED ORDER — ONDANSETRON HCL 4 MG PO TABS: 4.0000 mg | ORAL_TABLET | ORAL | Status: DC | PRN

## 2018-12-17 MED ORDER — DIBUCAINE 1 % RE OINT: 1.0000 "application " | TOPICAL_OINTMENT | RECTAL | Status: DC | PRN

## 2018-12-17 MED ORDER — PHENYLEPHRINE 40 MCG/ML (10ML) SYRINGE FOR IV PUSH (FOR BLOOD PRESSURE SUPPORT)
PREFILLED_SYRINGE | INTRAVENOUS | Status: AC
  Filled 2018-12-17: qty 10

## 2018-12-17 MED ORDER — OXYTOCIN 40 UNITS IN NORMAL SALINE INFUSION - SIMPLE MED
2.5000 [IU]/h | INTRAVENOUS | Status: DC
  Filled 2018-12-17: qty 1000

## 2018-12-17 NOTE — Progress Notes (Signed)
Labor Progress Note Brittany Welch is a 33 y.o. G2P1001 at [redacted]w[redacted]d presented for IOL for postdates  S:  Patient comfortable  O:  BP 112/70   Pulse 76   Temp 99 F (37.2 C) (Axillary)   Resp 16   Ht 5\' 5"  (1.651 m)   Wt 67.1 kg   LMP 03/05/2018   BMI 24.63 kg/m   Fetal Tracing:  Baseline: 140 Variability: moderate Accels: 15x15 Decels: early  Toco: 2-6   CVE: Dilation: 6 Effacement (%): 80 Station: 0 Presentation: Vertex Exam by:: Ma Hillock, CNM   A&P: 33 y.o. G2P1001 [redacted]w[redacted]d IOL for postdates #Labor: Progressing well. Offered AROM for augmentation. Patient declines. Will recheck in 2 hours. #Pain: per patient request #FWB: Cat 1 #GBS negative  Rolm Bookbinder, CNM 12:27 PM

## 2018-12-17 NOTE — Anesthesia Postprocedure Evaluation (Signed)
Anesthesia Post Note  Patient: Brittany Welch  Procedure(s) Performed: AN AD HOC LABOR EPIDURAL     Anesthesia Type: Epidural    Last Vitals:  Vitals:   12/17/18 1401 12/17/18 1410  BP: (!) 124/59   Pulse: 91   Resp: 16   Temp:  36.8 C    Last Pain:  Vitals:   12/17/18 1410  TempSrc: Oral  PainSc:    Pain Goal: Patients Stated Pain Goal: 0 (12/17/18 0930)                 Caren Macadam

## 2018-12-17 NOTE — Anesthesia Preprocedure Evaluation (Signed)
Anesthesia Evaluation  Patient identified by MRN, date of birth, ID band Patient awake    Reviewed: Allergy & Precautions, H&P , NPO status , Patient's Chart, lab work & pertinent test results  Airway Mallampati: I  TM Distance: >3 FB Neck ROM: full    Dental no notable dental hx. (+) Teeth Intact   Pulmonary neg pulmonary ROS,    Pulmonary exam normal breath sounds clear to auscultation       Cardiovascular negative cardio ROS Normal cardiovascular exam Rhythm:regular Rate:Normal     Neuro/Psych negative neurological ROS  negative psych ROS   GI/Hepatic negative GI ROS, Neg liver ROS,   Endo/Other  negative endocrine ROS  Renal/GU negative Renal ROS  negative genitourinary   Musculoskeletal negative musculoskeletal ROS (+)   Abdominal Normal abdominal exam  (+)   Peds  Hematology negative hematology ROS (+)   Anesthesia Other Findings   Reproductive/Obstetrics (+) Pregnancy                             Anesthesia Physical Anesthesia Plan  ASA: II  Anesthesia Plan: Epidural   Post-op Pain Management:    Induction:   PONV Risk Score and Plan:   Airway Management Planned:   Additional Equipment:   Intra-op Plan:   Post-operative Plan:   Informed Consent: I have reviewed the patients History and Physical, chart, labs and discussed the procedure including the risks, benefits and alternatives for the proposed anesthesia with the patient or authorized representative who has indicated his/her understanding and acceptance.       Plan Discussed with:   Anesthesia Plan Comments:         Anesthesia Quick Evaluation  

## 2018-12-17 NOTE — H&P (Signed)
OBSTETRIC ADMISSION HISTORY AND PHYSICAL  Brittany Welch is a 33 y.o. female G2P1001 with IUP at [redacted]w[redacted]d by LMP presenting for IOL for postdates. She reports +FMs, No LOF, no VB, no blurry vision, headaches or peripheral edema, and RUQ pain.  She plans on breast feeding. She request nothing for birth control. She received her prenatal care at [facility]  Dating: By LMP --->  Estimated Date of Delivery: 12/10/18   Nursing Staff Provider  Office Location  [place] Dating  LMP  Language  English Anatomy US  Normal except increased nuchal translucency, offered genetic screening/amnio but pt declines  Flu Vaccine   Genetic Screen  NIPS:Declined a genetic screen   AFP:     TDaP vaccine   Declines Hgb A1C or  GTT Early  Third trimester: normal  Rhogam     LAB RESULTS   Feeding Plan  breast Blood Type A/RH(D) POSITIVE/-- (10/08 1539)   Contraception  none Antibody NO ANTIBODIES DETECTED (10/08 1539)  Circumcision  n/a Rubella 0.99 (10/08 1539)  Pediatrician   RPR NON-REACTIVE (10/08 1539)   Support Person Kaleb HBsAg NON-REACTIVE (10/08 1539)   Prenatal Classes  HIV NON-REACTIVE (10/08 1539)  BTL Consent  GBS  (For PCN allergy, check sensitivities)   VBAC Consent  Pap Declined    Hgb Electro  Declined    CF Declined    SMA Declined    Waterbirth  [ ]  Class [ ]  Consent [ ]  CNM visit   Prenatal History/Complications:  Past Medical History: History reviewed. No pertinent past medical history.  Past Surgical History: Past Surgical History:  Procedure Laterality Date  . DENTAL SURGERY    . NO PAST SURGERIES      Obstetrical History: OB History    Gravida  2   Para  1   Term  1   Preterm      AB      Living  1     SAB      TAB      Ectopic      Multiple  0   Live Births  1           Social History: Social History   Socioeconomic History  . Marital status: Married    Spouse name: Not on file  . Number of children: Not on file  . Years of education: Not on file   . Highest education level: Not on file  Occupational History  . Occupation: homemaker  Social Needs  . Financial resource strain: Not hard at all  . Food insecurity:    Worry: Never true    Inability: Never true  . Transportation needs:    Medical: No    Non-medical: Not on file  Tobacco Use  . Smoking status: Never Smoker  . Smokeless tobacco: Never Used  Substance and Sexual Activity  . Alcohol use: No  . Drug use: No  . Sexual activity: Yes    Birth control/protection: None  Lifestyle  . Physical activity:    Days per week: Not on file    Minutes per session: Not on file  . Stress: Not at all  Relationships  . Social connections:    Talks on phone: Not on file    Gets together: Not on file    Attends religious service: Not on file    Active member of club or organization: Not on file    Attends meetings of clubs or organizations: Not on file  Relationship status: Not on file  Other Topics Concern  . Not on file  Social History Narrative  . Not on file    Family History: Family History  Problem Relation Age of Onset  . Diabetes Mother   . Lung cancer Paternal Grandmother     Allergies: Allergies  Allergen Reactions  . Latex Itching and Other (See Comments)    Itching and redness at site where exposed to latex     Medications Prior to Admission  Medication Sig Dispense Refill Last Dose  . Prenatal Multivit-Min-Fe-FA (PRENATAL VITAMINS PO) Take by mouth.   Taking     Review of Systems   All systems reviewed and negative except as stated in HPI  Blood pressure 115/74, pulse 90, temperature 97.8 F (36.6 C), temperature source Oral, resp. rate 16, height 5\' 5"  (1.651 m), weight 67.1 kg, last menstrual period 03/05/2018. General appearance: alert, cooperative and no distress Lungs: clear to auscultation bilaterally Heart: regular rate and rhythm Abdomen: soft, non-tender; bowel sounds normal Pelvic: n/a Extremities: Homans sign is negative, no  sign of DVT DTR's +2 Presentation: cephalic Fetal monitoringBaseline: 135 bpm, Variability: Good {> 6 bpm), Accelerations: Reactive and Decelerations: Absent Uterine activity: occasional uc's  Dilation: 3 Effacement (%): 80 Station: 0 Exam by:: Ma Hillock, CNM   Prenatal labs: ABO, Rh: A/RH(D) POSITIVE/-- (10/08 1539) Antibody: NO ANTIBODIES DETECTED (10/08 1539) Rubella: 0.99 (10/08 1539) RPR: NON-REACTIVE (12/27 0914)  HBsAg: NON-REACTIVE (10/08 1539)  HIV: NON-REACTIVE (12/27 0914)  GBS:   Negative  Prenatal Transfer Tool  Maternal Diabetes: No Genetic Screening: Declined Maternal Ultrasounds/Referrals: Normal Fetal Ultrasounds or other Referrals:  None Maternal Substance Abuse:  No Significant Maternal Medications:  None Significant Maternal Lab Results: Lab values include: Group B Strep negative  No results found for this or any previous visit (from the past 24 hour(s)).  Patient Active Problem List   Diagnosis Date Noted  . Encounter for induction of labor 12/17/2018  . Nuchal fold thickening on prenatal ultrasound 07/26/2018  . Supervision of other normal pregnancy, antepartum 06/21/2018    Assessment/Plan:  Brittany Welch is a 33 y.o. G2P1001 at [redacted]w[redacted]d here for IOL for postdates  #Labor: Favorable cervix. Will start pitocin 2x2. Plan AROM when able #Pain: Per patient request #FWB: Cat 1 #ID:  GBS neg #MOF: Breast #MOC: none #Circ: n/a  Rolm Bookbinder, CNM  12/17/2018, 8:37 AM

## 2018-12-17 NOTE — Progress Notes (Signed)
Labor Progress Note Brittany Welch is a 33 y.o. G2P1001 at [redacted]w[redacted]d presented for postdates  S:  Patient is feeling menstrual cramps and pressure in back.  O:  BP (!) 124/59   Pulse 91   Temp 99 F (37.2 C) (Axillary)   Resp 16   Ht 5\' 5"  (1.651 m)   Wt 67.1 kg   LMP 03/05/2018   BMI 24.63 kg/m   Fetal Tracing:  Baseline: 140  Variability: moderate Accels: 15x15 Decels: early/variable  Toco: 2-4   CVE: Dilation: 6 Effacement (%): 80 Station: 0 Presentation: Vertex Exam by:: Ma Hillock, CNM   A&P: 33 y.o. G2P1001 [redacted]w[redacted]d IOL for postdates #Labor: Patient starting to feel more contractions but cervix unchanged. Recommended AROM again. Patient agreeable to plan of care. AROM with small amount of clear fluid #Pain: per patient request #FWB: Cat 1 #GBS negative  Rolm Bookbinder, CNM 2:16 PM

## 2018-12-17 NOTE — Progress Notes (Signed)
Teigen Brookover Michel is a 33 y.o. G2P1001 at [redacted]w[redacted]d by LMP admitted for induction of labor due to postdates.  Subjective: Pt comfortable, husband at bedside for support.   Objective: BP 121/70   Pulse 79   Temp 97.8 F (36.6 C) (Oral)   Resp 16   Ht 5\' 5"  (1.651 m)   Wt 67.1 kg   LMP 03/05/2018   BMI 24.63 kg/m  No intake/output data recorded. No intake/output data recorded.  FHT:  FHR: 135 bpm, variability: moderate,  accelerations:  Present,  decelerations:  Absent UC:   regular, every 10-12 minutes SVE:   Dilation: 3 Effacement (%): 80 Station: 0 Exam by:: Ma Hillock, CNM  Labs: Lab Results  Component Value Date   WBC 12.6 (H) 12/17/2018   HGB 12.0 12/17/2018   HCT 38.5 12/17/2018   MCV 82.3 12/17/2018   PLT 197 12/17/2018    Assessment / Plan: Induction of labor due to postdates,  progressing well on pitocin  Labor: Doing well, Cleone Slim, CNM, is in house and plans to manage and deliver pt.  Pt aware of team today if other providers need to be present.  Preeclampsia:  n/a Fetal Wellbeing:  Category I Pain Control:  Labor support without medications I/D:  GBS neg Anticipated MOD:  NSVD  Tawnie Ehresman Leftwich-Kirby 12/17/2018, 10:08 AM

## 2018-12-17 NOTE — Discharge Summary (Signed)
Postpartum Discharge Summary  Patient Name: Brittany Welch DOB: 30-Nov-1985 MRN: [id number]  Date of admission: 12/17/2018 Delivering Provider: Rolm Bookbinder   Date of discharge: 12/18/2018  Admitting diagnosis: pregnancy Intrauterine pregnancy: [redacted]w[redacted]d     Secondary diagnosis:  Active Problems:   Nuchal fold thickening on prenatal ultrasound   Encounter for induction of labor     Discharge diagnosis: Term Pregnancy Delivered                                    Postpartum procedures:none Augmentation: AROM and Pitocin Complications: None  Hospital course:  Induction of Labor With Vaginal Delivery   33 y.o. yo G2P1001 at [redacted]w[redacted]d was admitted to the hospital 12/17/2018 for induction of labor.  Indication for induction: Postdates.  Patient had an uncomplicated labor course as follows: Membrane Rupture Time/Date: 2:10 PM ,12/17/2018   Intrapartum Procedures: Episiotomy: None [1]                                         Lacerations:  1st degree [2]  Patient had delivery of a Viable infant.  Information for the patient's newborn:  Harding, Girl Rohnda [[id number]]  Delivery Method: Vaginal, Spontaneous(Filed from Delivery Summary)  12/17/2018  Details of delivery can be found in separate delivery note.  Patient had a routine postpartum course. Patient is discharged home 12/18/18.  Magnesium Sulfate recieved: No BMZ received: No  Physical exam  Vitals:   12/17/18 1849 12/17/18 2158 12/18/18 0200 12/18/18 0500  BP: 105/74 109/71 112/68 99/67  Pulse: 67 92 88 (!) 57  Resp:  18 18 18   Temp: 98.5 F (36.9 C) 98.7 F (37.1 C) 98.5 F (36.9 C) 98.7 F (37.1 C)  TempSrc: Oral Oral Oral Oral  SpO2:  99% 100% 100%  Weight:      Height:       General: alert, well-appearing, NAD  Lochia: appropriate Uterine Fundus: firm Incision: N/A DVT Evaluation: No significant calf/ankle edema.  Labs: Lab Results  Component Value Date   WBC 14.0 (H) 12/18/2018   HGB 10.8 (L) 12/18/2018   HCT 34.8 (L)  12/18/2018   MCV 83.9 12/18/2018   PLT 167 12/18/2018   CMP Latest Ref Rng & Units 07/11/2009  Glucose 70 - 99 mg/dL 341(P)  BUN 6 - 23 mg/dL 15  Creatinine 0.4 - 1.2 mg/dL 0.9  Sodium 379 - 024 mEq/L 142  Potassium 3.5 - 5.1 mEq/L 4.0  Chloride 96 - 112 mEq/L 104  CO2 19 - 32 mEq/L 26  Calcium 8.4 - 10.5 mg/dL 9.8  Total Protein 6.0 - 8.3 g/dL 7.6  Total Bilirubin 0.3 - 1.2 mg/dL 0.8  Alkaline Phos 39 - 117 U/L 63  AST 0 - 37 U/L 25  ALT 0 - 35 U/L 12    Discharge instruction: per After Visit Summary and "Baby and Me Booklet".  After visit meds:  Allergies as of 12/18/2018      Reactions   Latex Itching, Other (See Comments)   Itching and redness at site where exposed to latex       Medication List    TAKE these medications   ibuprofen 800 MG tablet Commonly known as:  ADVIL,MOTRIN Take 1 tablet (800 mg total) by mouth 3 (three) times daily.   PRENATAL VITAMINS PO Take by mouth.  senna-docusate 8.6-50 MG tablet Commonly known as:  Senokot-S Take 2 tablets by mouth at bedtime as needed for mild constipation.      Diet: routine diet Activity: Advance as tolerated. Pelvic rest for 6 weeks.   Outpatient follow up:4 weeks Follow up Appt:No future appointments. Follow up Visit: Follow-up Information    [facility] Follow up.   Specialty:  Obstetrics and Gynecology Contact information: [address], [address] [place] [address] [phone]        Please schedule this patient for PP visit in: 4 weeks Low risk pregnancy complicated by: n/a Delivery mode:  SVD Anticipated Birth Control:  none PP Procedures needed: n/a  Schedule Integrated BH visit: no Provider: Any Provider  Newborn Data: Live born female  Birth Weight:  3755 APGAR: 8, 9  Newborn Delivery   Birth date/time:  12/17/2018 15:41:00 Delivery type:  Vaginal, Spontaneous    Baby Feeding: Breast Disposition:home with mother  Cristal Deer.  Earlene Plater, DO OB/GYN Fellow

## 2018-12-17 NOTE — Lactation Note (Addendum)
This note was copied from a baby's chart. Lactation Consultation Note  Patient Name: Brittany Welch OHFGB'[id number] Date: 12/17/2018 Reason for consult: Initial assessment P2, 5 hour female infant. One void and one stool since delivery. Per mom, she feels breastfeeding is going well. Per mom, her eldest son would never latch and was biting at breast nor would he suck on a gloved finger, so she pumped for 8 weeks until her milk volume decreased. Mom knows how to hand express and colostrum is present. LC observed mom has flat nipples slight inversion that responds well to stimulation, breast shells given mom knows to wear during the day and not sleep in them at night. Mom had breast shells previously with her son. Per mom, infant had breastfeed twice and less than one hour prior to [facility] entering the room. LC asked mom to do breast stimulation and hand express a little prior to latching. Mom does want LC to  assess latch, infant latched to left breast using cross cradle hold, nose to breast,  Infant was in rhythmic pattern, only suckle for 3 minutes not hungry. LC reviewed mom should feel tug but not pain with latch and explained as latch was broken that mom nipple was well rounded and not pinched which is good.  Mom felt better that latch was assessed and mom is feeling more confident with breastfeeding. Mom knows to breastfeed according hunger cues, 8 or more times within 24 hours. LC discussed I & O. Mom knows to call Nurse or LC if she has any questions,concerns or needs assistance with latch.  Mom made aware of O/P services, breastfeeding support groups, community resources, and our phone # for post-discharge questions.    Maternal Data Formula Feeding for Exclusion: No Has patient been taught Hand Expression?: Yes Does the patient have breastfeeding experience prior to this delivery?: Yes(Per mom. her son would not latch, she BF for 8 weeks, pumped)  Feeding Feeding Type: Breast Fed  LATCH  Score Latch: Grasps breast easily, tongue down, lips flanged, rhythmical sucking.  Audible Swallowing: A few with stimulation  Type of Nipple: Flat  Comfort (Breast/Nipple): Soft / non-tender  Hold (Positioning): Assistance needed to correctly position infant at breast and maintain latch.  LATCH Score: 7  Interventions Interventions: Breast feeding basics reviewed;Assisted with latch;Skin to skin;Breast massage;Hand express;Breast compression;Adjust position;Support pillows;Position options;Expressed milk;Shells  Lactation Tools Discussed/Used WIC Program: No   Consult Status Consult Status: Follow-up Date: 12/25/18 Follow-up type: In-patient    Danelle Earthly 12/17/2018, 9:05 PM

## 2018-12-18 LAB — CBC
HCT: 34.8 % — ABNORMAL LOW (ref 36.0–46.0)
Hemoglobin: 10.8 g/dL — ABNORMAL LOW (ref 12.0–15.0)
MCH: 26 pg (ref 26.0–34.0)
MCHC: 31 g/dL (ref 30.0–36.0)
MCV: 83.9 fL (ref 80.0–100.0)
Platelets: 167 10*3/uL (ref 150–400)
RBC: 4.15 MIL/uL (ref 3.87–5.11)
RDW: 17.9 % — ABNORMAL HIGH (ref 11.5–15.5)
WBC: 14 10*3/uL — ABNORMAL HIGH (ref 4.0–10.5)
nRBC: 0 % (ref 0.0–0.2)

## 2018-12-18 LAB — RPR: RPR Ser Ql: NONREACTIVE

## 2018-12-18 MED ORDER — SENNOSIDES-DOCUSATE SODIUM 8.6-50 MG PO TABS: 2.0000 | ORAL_TABLET | Freq: Every evening | ORAL | Status: DC | PRN

## 2018-12-18 MED ORDER — IBUPROFEN 800 MG PO TABS: 800.0000 mg | ORAL_TABLET | Freq: Three times a day (TID) | ORAL | 0 refills | Status: DC

## 2018-12-18 NOTE — Lactation Note (Addendum)
This note was copied from a baby's chart. Lactation Consultation Note  Patient Name: Brittany Welch Date: 12/18/2018 Reason for consult: Term;Difficult latch;Mother's request P2, 14 hour female infant, -5% weight loss. Mom has flat and semi  inverted nipples requested LC services due to difficulties with infant latching to breast ( trauma). Mom has trauma on  both breast abrasions with sore nipples. LC examined infant's mouth ask mom to discuss with pediatrician to examine tongue which frenulum appears to be short and tight. Mom had used a nipple shield previous with eldest child but did not like it. Mom fitted with 20 mm NS and mom stated nipples are sore but she can tell a difference not as painful. Mom latched infant on left breast using cross cradle hold, swallowing observed, deep latch  and infant breastfeed for  25 minutes when LC left room with 20 mm NS. Mom shown how to use DEBP & how to disassemble, clean, & reassemble parts. Mom will use DEBP every 3 hours for 15 minutes on initial setting. Mom given coconut oil and has comfort gels but mom knows not to use them together. Mom will call Nurse or LC if she has questions, concerns or need assistance with latching infant to breast. Mom will continue to breastfeed according hunger cues, 8 or more times within 24 hours. Maternal Data    Feeding Feeding Type: Breast Fed  LATCH Score Latch: Grasps breast easily, tongue down, lips flanged, rhythmical sucking.  Audible Swallowing: A few with stimulation  Type of Nipple: Inverted  Comfort (Breast/Nipple): Filling, red/small blisters or bruises, mild/mod discomfort  Hold (Positioning): Assistance needed to correctly position infant at breast and maintain latch.  LATCH Score: 5  Interventions Interventions: Comfort gels;DEBP  Lactation Tools Discussed/Used Tools: Pump;Nipple Shields Nipple shield size: 20 Breast pump type: Double-Electric Breast Pump Pump Review:  Setup, frequency, and cleaning Initiated by:: Danelle Earthly, IBCLC Date initiated:: 12/18/18   Consult Status Consult Status: Follow-up Date: 12/18/18 Follow-up type: In-patient    Danelle Earthly 12/18/2018, 6:30 AM

## 2018-12-18 NOTE — Lactation Note (Signed)
This note was copied from a baby's chart. Lactation Consultation Note  Patient Name: Girl Kaylise Fauss ELTRV'U Date: 12/18/2018 Reason for consult: Follow-up assessment;Difficult latch;Term  P2 mother whose infant is now 17 hours old.    Mother had just finished breast feeding infant before I arrived.  Baby was sleeping in mother's arms.  Mother stated that her nipples are sore but she is now using a #20 NS with relief. Suggested she use EBM to rub into nipples/areolas after feeding and continue with her coconut oil.  She also has comfort gels.  Feedings are getting better and mother does not require any assistance at this time.  Encouraged her to continue performing hand expression before/after feedings to help increase milk supply.  Mother will feed back any EBM she obtains to baby.    She will call for latch assistance as needed.  Father present.   Maternal Data Formula Feeding for Exclusion: No Has patient been taught Hand Expression?: Yes  Feeding    LATCH Score                   Interventions    Lactation Tools Discussed/Used Initiated by:: Already initiated   Consult Status Consult Status: Follow-up Date: 12/19/18 Follow-up type: In-patient    Jaclyn Andy R Arissa Fagin 12/18/2018, 3:55 PM

## 2018-12-19 ENCOUNTER — Telehealth: Payer: Self-pay | Admitting: *Deleted

## 2018-12-19 NOTE — Telephone Encounter (Signed)
Left patient a message to call and schedule 4 week Postpartum appointment. 

## 2019-01-17 ENCOUNTER — Encounter: Payer: Self-pay | Admitting: Advanced Practice Midwife

## 2019-01-17 ENCOUNTER — Other Ambulatory Visit: Payer: Self-pay

## 2019-01-17 ENCOUNTER — Ambulatory Visit (INDEPENDENT_AMBULATORY_CARE_PROVIDER_SITE_OTHER): Payer: 59 | Admitting: Advanced Practice Midwife

## 2019-01-17 DIAGNOSIS — Z3009 Encounter for other general counseling and advice on contraception: Secondary | ICD-10-CM

## 2019-01-17 NOTE — Progress Notes (Addendum)
TELEHEALTH VIRTUAL OBSTETRICS VISIT ENCOUNTER NOTE  I connected with Brittany Welch on 01/18/19 at  2:00 PM EDT by telephone at home and verified that I am speaking with the correct person using two identifiers.   I discussed the limitations, risks, security and privacy concerns of performing an evaluation and management service by telephone and the availability of in person appointments. I also discussed with the patient that there may be a patient responsible charge related to this service. The patient expressed understanding and agreed to proceed.  Appointment Date: 01/17/2019  OBGYN Clinic: [name]   Chief Complaint:  Chief Complaint  Patient presents with  . Postpartum Care    History of Present Illness: Brittany Welch is a 33 y.o. G61P2002 female who presents for a postpartum visit. She is 4 weeks postpartum following a spontaneous vaginal delivery. I have fully reviewed the prenatal and intrapartum course. The delivery was at 41 gestational weeks.  Anesthesia: none. Postpartum course has been unremarkable. Baby's course has been unremarkable. Baby is feeding by breast. Bleeding staining only. Bowel function is normal. Bladder function is normal. Patient is not sexually active. Contraception method is condoms. Postpartum depression screening:neg  The following portions of the patient's history were reviewed and updated as appropriate: allergies, current medications, past family history, past medical history, past social history, past surgical history and problem list. Pt has never had a Pap and declines Pap.     Review of Systems: Her 12 point review of systems is negative or as noted in the History of Present Illness.  Patient Active Problem List   Diagnosis Date Noted  . Encounter for induction of labor 12/17/2018  . Nuchal fold thickening on prenatal ultrasound 07/26/2018  . Supervision of other normal pregnancy, antepartum 06/21/2018    Medications Emersynn B.  Schlesinger had no medications administered during this visit. Current Outpatient Medications  Medication Sig Dispense Refill  . ibuprofen (ADVIL,MOTRIN) 800 MG tablet Take 1 tablet (800 mg total) by mouth 3 (three) times daily. 30 tablet 0  . Prenatal Multivit-Min-Fe-FA (PRENATAL VITAMINS PO) Take by mouth.    . senna-docusate (SENOKOT-S) 8.6-50 MG tablet Take 2 tablets by mouth at bedtime as needed for mild constipation. (Patient not taking: Reported on 01/17/2019)     No current facility-administered medications for this visit.     Allergies Latex  Physical Exam:  There were no vitals taken for this visit.  General:  Alert, oriented and cooperative. Patient is in no acute distress.  Mental Status: Normal mood and affect. Normal behavior. Normal judgment and thought content.   Respiratory: Normal respiratory effort noted, no problems with respiration noted  Rest of physical exam deferred due to type of encounter  PP Depression Screening:   Edinburgh Postnatal Depression Scale - 01/17/19 1354      Edinburgh Postnatal Depression Scale:  In the Past 7 Days   I have been able to laugh and see the funny side of things.  0    I have looked forward with enjoyment to things.  0    I have blamed myself unnecessarily when things went wrong.  0    I have been anxious or worried for no good reason.  0    I have felt scared or panicky for no good reason.  0    Things have been getting on top of me.  0    I have been so unhappy that I have had difficulty sleeping.  0  I have felt sad or miserable.  0    I have been so unhappy that I have been crying.  0    The thought of harming myself has occurred to me.  0    Edinburgh Postnatal Depression Scale Total  0       Assessment:Patient is a 33 y.o. [id number] who is 4 weeks postpartum from a normal spontaneous vaginal delivery.  She is doing very well.   Plan: 1. Postpartum examination following vaginal delivery --Doing well, bonding with infant,  good support system. --Discussed Pap exam, reasons for test, current guidelines on how often Pap is needed, etc. Pt declines Pap at this time, but will consider it for the future.  2. Encounter for counseling regarding contraception --Desires condoms at this time.  Aware of other more effective methods if desired.  RTC 1 year for well woman exam  I discussed the assessment and treatment plan with the patient. The patient was provided an opportunity to ask questions and all were answered. The patient agreed with the plan and demonstrated an understanding of the instructions.   The patient was advised to call back or seek an in-person evaluation/go to the ED for any concerning postpartum symptoms.  I provided 8 minutes of non face-to-face time during this encounter.   Sharen CounterLisa Leftwich-Kirby, CNM [facility]

## 2020-05-02 ENCOUNTER — Encounter: Payer: Self-pay | Admitting: *Deleted

## 2020-05-02 DIAGNOSIS — Z3480 Encounter for supervision of other normal pregnancy, unspecified trimester: Secondary | ICD-10-CM | POA: Insufficient documentation

## 2020-05-07 ENCOUNTER — Ambulatory Visit (INDEPENDENT_AMBULATORY_CARE_PROVIDER_SITE_OTHER): Payer: 59 | Admitting: Certified Nurse Midwife

## 2020-05-07 ENCOUNTER — Other Ambulatory Visit (HOSPITAL_COMMUNITY)
Admission: RE | Admit: 2020-05-07 | Discharge: 2020-05-07 | Disposition: A | Discharge status: Home / Self Care | Payer: 59 | Source: Ambulatory Visit | Attending: Certified Nurse Midwife | Admitting: Certified Nurse Midwife

## 2020-05-07 ENCOUNTER — Other Ambulatory Visit: Payer: Self-pay

## 2020-05-07 ENCOUNTER — Encounter: Payer: Self-pay | Admitting: Certified Nurse Midwife

## 2020-05-07 DIAGNOSIS — Z3480 Encounter for supervision of other normal pregnancy, unspecified trimester: Secondary | ICD-10-CM | POA: Diagnosis not present

## 2020-05-07 NOTE — Progress Notes (Signed)
Bedside U/S shows single IUP with FHT of 146 BPM and FL measures 1.27cm  GA [redacted]w[redacted]d

## 2020-05-07 NOTE — Progress Notes (Signed)
History:   Brittany Welch is a 34 y.o. G3P2002 at [redacted]w[redacted]d by LMP being seen today for her first obstetrical visit.  Her obstetrical history is significant for hx of 2 SVD . Patient does intend to breast feed. Pregnancy history fully reviewed.  Patient reports no bleeding and vomiting. Patient reports having morning sickness this pregnancy. Declines medication at this time.       HISTORY: OB History  Gravida Para Term Preterm AB Living  3 2 2  0 0 2  SAB TAB Ectopic Multiple Live Births  0 0 0 0 2    # Outcome Date GA Lbr Len/2nd Weight Sex Delivery Anes PTL Lv  3 Current           2 Term 12/17/18 [redacted]w[redacted]d 03:09 / 00:09 8 lb 4.5 oz (3.755 kg) F Vag-Spont None  LIV     Birth Comments: none     Name: Engen,GIRL Mc     Apgar1: 8  Apgar5: 9  1 Term 10/28/16 [redacted]w[redacted]d 05:21 / 03:41 9 lb 9.3 oz (4.345 kg) M Vag-Spont EPI, Local  LIV     Name: Beringer,BOY Oriana     Apgar1: 7  Apgar5: 9    Patient reports that she has never had pap smear and declines pap.   No past medical history on file. Past Surgical History:  Procedure Laterality Date  . DENTAL SURGERY    . NO PAST SURGERIES     Family History  Problem Relation Age of Onset  . Diabetes Mother   . Lung cancer Paternal Grandmother    Social History   Tobacco Use  . Smoking status: Never Smoker  . Smokeless tobacco: Never Used  Vaping Use  . Vaping Use: Never used  Substance Use Topics  . Alcohol use: No  . Drug use: No   Allergies  Allergen Reactions  . Latex Itching and Other (See Comments)    Itching and redness at site where exposed to latex    Current Outpatient Medications on File Prior to Visit  Medication Sig Dispense Refill  . Prenatal Multivit-Min-Fe-FA (PRENATAL VITAMINS PO) Take by mouth.     No current facility-administered medications on file prior to visit.    Review of Systems Pertinent items noted in HPI and remainder of comprehensive ROS otherwise negative. Physical Exam:   Vitals:   05/07/20  1338  BP: 110/69  Pulse: 60  Weight: 130 lb (59 kg)   Fetal Heart Rate (bpm): 146  System: General: well-developed, well-nourished female in no acute distress   Skin: normal coloration and turgor, no rashes   Neurologic: oriented, normal, negative, normal mood   Extremities: normal strength, tone, and muscle mass, ROM of all joints is normal   HEENT PERRLA, extraocular movement intact and sclera clear   Mouth/Teeth mucous membranes moist, pharynx normal without lesions and dental hygiene good   Neck supple and no masses   Cardiovascular: regular rate and rhythm   Respiratory:  no respiratory distress, normal breath sounds   Abdomen: soft, non-tender; bowel sounds normal; no masses,  no organomegaly    Assessment:    Pregnancy: 05/09/20 Patient Active Problem List   Diagnosis Date Noted  . Supervision of other normal pregnancy, antepartum 05/02/2020     Plan:    1. Supervision of other normal pregnancy, antepartum - Welcomed patient back to practice and congratulated patient on pregnancy  - Reviewed safety, visitor policy, reassurance about COVID-19 for pregnancy at this time. Discussed possible changes to  visits, including televisits, that may occur due to COVID-19.  The office remains open if pt needs to be seen and MAU is open 24 hours/day for OB emergencies. - Routine prenatal care - Anticipatory guidance on upcoming appointments including anatomy US  - Obstetric panel - HIV antibody (with reflex) - Hepatitis C Antibody - Culture, OB Urine - [place] bedside; Future - [name] Schedule Optimization - Hemoglobinopathy Evaluation - [place] MFM OB COMP + 14 WK; Future - Urine cytology ancillary only([place])   Initial labs drawn. Continue prenatal vitamins. Problem list reviewed and updated. Genetic Screening discussed, First trimester screen and NIPS: declined. Ultrasound discussed; fetal anatomic survey: ordered. Anticipatory guidance about prenatal visits given  including labs, ultrasounds, and testing. Discussed usage of [name] and virtual visits as additional source of managing and completing prenatal visits in midst of coronavirus and pandemic.   Encouraged to complete MyChart Registration for her ability to review results, send requests, and have questions addressed.  The nature of [facility] with multiple MDs and Advanced Practice Providers was explained to patient; also emphasized that residents, students are part of our team. Routine obstetric precautions reviewed. Encouraged to seek out care at office or emergency room [facility] MAU preferred) for urgent and/or emergent concerns. Return in about 4 weeks (around 06/04/2020) for ROB.     Sharyon Cable, CNM [facility] for [employer], [facility]

## 2020-05-07 NOTE — Patient Instructions (Signed)

## 2020-05-08 LAB — URINE CYTOLOGY ANCILLARY ONLY
Chlamydia: NEGATIVE
Comment: NEGATIVE
Comment: NORMAL
Neisseria Gonorrhea: NEGATIVE

## 2020-05-09 LAB — OBSTETRIC PANEL
Absolute Monocytes: 420 cells/uL (ref 200–950)
Antibody Screen: NOT DETECTED
Basophils Absolute: 23 cells/uL (ref 0–200)
Basophils Relative: 0.3 %
Eosinophils Absolute: 53 cells/uL (ref 15–500)
Eosinophils Relative: 0.7 %
HCT: 39.4 % (ref 35.0–45.0)
Hemoglobin: 12.6 g/dL (ref 11.7–15.5)
Hepatitis B Surface Ag: NONREACTIVE
Lymphs Abs: 1650 cells/uL (ref 850–3900)
MCH: 27.7 pg (ref 27.0–33.0)
MCHC: 32 g/dL (ref 32.0–36.0)
MCV: 86.6 fL (ref 80.0–100.0)
MPV: 11.6 fL (ref 7.5–12.5)
Monocytes Relative: 5.6 %
Neutro Abs: 5355 cells/uL (ref 1500–7800)
Neutrophils Relative %: 71.4 %
Platelets: 202 10*3/uL (ref 140–400)
RBC: 4.55 10*6/uL (ref 3.80–5.10)
RDW: 13.9 % (ref 11.0–15.0)
RPR Ser Ql: NONREACTIVE
Rubella: <0.9 Index — ABNORMAL LOW
Total Lymphocyte: 22 %
WBC: 7.5 10*3/uL (ref 3.8–10.8)

## 2020-05-09 LAB — HEMOGLOBINOPATHY EVALUATION
Fetal Hemoglobin Testing: <1 % (ref 0.0–1.9)
HCT: 39.6 % (ref 35.0–45.0)
Hemoglobin A2 - HGBRFX: 2.3 % (ref 2.2–3.2)
Hemoglobin: 12.6 g/dL (ref 11.7–15.5)
Hgb A: 97.7 % (ref >96.0)
MCH: 27.9 pg (ref 27.0–33.0)
MCV: 87.8 fL (ref 80.0–100.0)
RBC: 4.51 10*6/uL (ref 3.80–5.10)
RDW: 13.5 % (ref 11.0–15.0)

## 2020-05-09 LAB — HEPATITIS C ANTIBODY
Hepatitis C Ab: NONREACTIVE
SIGNAL TO CUT-OFF: 0.05 (ref <1.00)

## 2020-05-09 LAB — URINE CULTURE, OB REFLEX

## 2020-05-09 LAB — HIV ANTIBODY (ROUTINE TESTING W REFLEX): HIV 1&2 Ab, 4th Generation: NONREACTIVE

## 2020-05-09 LAB — CULTURE, OB URINE

## 2020-06-04 ENCOUNTER — Encounter: Payer: Self-pay | Admitting: Certified Nurse Midwife

## 2020-06-04 ENCOUNTER — Telehealth (INDEPENDENT_AMBULATORY_CARE_PROVIDER_SITE_OTHER): Payer: 59 | Admitting: Certified Nurse Midwife

## 2020-06-04 VITALS — BP 91/58 | HR 62 | Wt 134.0 lb

## 2020-06-04 DIAGNOSIS — Z3A17 17 weeks gestation of pregnancy: Secondary | ICD-10-CM

## 2020-06-04 DIAGNOSIS — Z2839 Other underimmunization status: Secondary | ICD-10-CM

## 2020-06-04 DIAGNOSIS — O99891 Other specified diseases and conditions complicating pregnancy: Secondary | ICD-10-CM

## 2020-06-04 DIAGNOSIS — Z3480 Encounter for supervision of other normal pregnancy, unspecified trimester: Secondary | ICD-10-CM

## 2020-06-04 DIAGNOSIS — Z283 Underimmunization status: Secondary | ICD-10-CM

## 2020-06-04 DIAGNOSIS — O09899 Supervision of other high risk pregnancies, unspecified trimester: Secondary | ICD-10-CM

## 2020-06-04 NOTE — Patient Instructions (Addendum)
Safe Medications in Pregnancy   Acne: Benzoyl Peroxide Salicylic Acid  Backache/Headache: Tylenol: 2 regular strength every 4 hours OR              2 Extra strength every 6 hours  Colds/Coughs/Allergies: Benadryl (alcohol free) 25 mg every 6 hours as needed Breath right strips Claritin Cepacol throat lozenges Chloraseptic throat spray Cold-Eeze- up to three times per day Cough drops, alcohol free Flonase (by prescription only) Guaifenesin Mucinex Robitussin DM (plain only, alcohol free) Saline nasal spray/drops Sudafed (pseudoephedrine) & Actifed ** use only after [redacted] weeks gestation and if you do not have high blood pressure Tylenol Vicks Vaporub Zinc lozenges Zyrtec   Constipation: Colace Ducolax suppositories Fleet enema Glycerin suppositories Metamucil Milk of magnesia Miralax Senokot Smooth move tea  Diarrhea: Kaopectate Imodium A-D  *NO pepto Bismol  Hemorrhoids: Anusol Anusol HC Preparation H Tucks  Indigestion: Tums Maalox Mylanta Zantac  Pepcid  Insomnia: Benadryl (alcohol free) 25mg every 6 hours as needed Tylenol PM Unisom, no Gelcaps  Leg Cramps: Tums MagGel  Nausea/Vomiting:  Bonine Dramamine Emetrol Ginger extract Sea bands Meclizine  Nausea medication to take during pregnancy:  Unisom (doxylamine succinate 25 mg tablets) Take one tablet daily at bedtime. If symptoms are not adequately controlled, the dose can be increased to a maximum recommended dose of two tablets daily (1/2 tablet in the morning, 1/2 tablet mid-afternoon and one at bedtime). Vitamin B6 100mg tablets. Take one tablet twice a day (up to 200 mg per day).  Skin Rashes: Aveeno products Benadryl cream or 25mg every 6 hours as needed Calamine Lotion 1% cortisone cream  Yeast infection: Gyne-lotrimin 7 Monistat 7   **If taking multiple medications, please check labels to avoid duplicating the same active ingredients **take medication as directed on  the label ** Do not exceed 4000 mg of tylenol in 24 hours **Do not take medications that contain aspirin or ibuprofen     Second Trimester of Pregnancy  The second trimester is from week 14 through week 27 (month 4 through 6). This is often the time in pregnancy that you feel your best. Often times, morning sickness has lessened or quit. You may have more energy, and you may get hungry more often. Your unborn baby is growing rapidly. At the end of the sixth month, he or she is about 9 inches long and weighs about 1 pounds. You will likely feel the baby move between 18 and 20 weeks of pregnancy. Follow these instructions at home: Medicines  Take over-the-counter and prescription medicines only as told by your doctor. Some medicines are safe and some medicines are not safe during pregnancy.  Take a prenatal vitamin that contains at least 600 micrograms (mcg) of folic acid.  If you have trouble pooping (constipation), take medicine that will make your stool soft (stool softener) if your doctor approves. Eating and drinking   Eat regular, healthy meals.  Avoid raw meat and uncooked cheese.  If you get low calcium from the food you eat, talk to your doctor about taking a daily calcium supplement.  Avoid foods that are high in fat and sugars, such as fried and sweet foods.  If you feel sick to your stomach (nauseous) or throw up (vomit): ? Eat 4 or 5 small meals a day instead of 3 large meals. ? Try eating a few soda crackers. ? Drink liquids between meals instead of during meals.  To prevent constipation: ? Eat foods that are high in fiber, like fresh fruits   and vegetables, whole grains, and beans. ? Drink enough fluids to keep your pee (urine) clear or pale yellow. Activity  Exercise only as told by your doctor. Stop exercising if you start to have cramps.  Do not exercise if it is too hot, too humid, or if you are in a place of great height (high altitude).  Avoid heavy  lifting.  Wear low-heeled shoes. Sit and stand up straight.  You can continue to have sex unless your doctor tells you not to. Relieving pain and discomfort  Wear a good support bra if your breasts are tender.  Take warm water baths (sitz baths) to soothe pain or discomfort caused by hemorrhoids. Use hemorrhoid cream if your doctor approves.  Rest with your legs raised if you have leg cramps or low back pain.  If you develop puffy, bulging veins (varicose veins) in your legs: ? Wear support hose or compression stockings as told by your doctor. ? Raise (elevate) your feet for 15 minutes, 3-4 times a day. ? Limit salt in your food. Prenatal care  Write down your questions. Take them to your prenatal visits.  Keep all your prenatal visits as told by your doctor. This is important. Safety  Wear your seat belt when driving.  Make a list of emergency phone numbers, including numbers for family, friends, the hospital, and police and fire departments. General instructions  Ask your doctor about the right foods to eat or for help finding a counselor, if you need these services.  Ask your doctor about local prenatal classes. Begin classes before month 6 of your pregnancy.  Do not use hot tubs, steam rooms, or saunas.  Do not douche or use tampons or scented sanitary pads.  Do not cross your legs for long periods of time.  Visit your dentist if you have not done so. Use a soft toothbrush to brush your teeth. Floss gently.  Avoid all smoking, herbs, and alcohol. Avoid drugs that are not approved by your doctor.  Do not use any products that contain nicotine or tobacco, such as cigarettes and e-cigarettes. If you need help quitting, ask your doctor.  Avoid cat litter boxes and soil used by cats. These carry germs that can cause birth defects in the baby and can cause a loss of your baby (miscarriage) or stillbirth. Contact a doctor if:  You have mild cramps or pressure in your  lower belly.  You have pain when you pee (urinate).  You have bad smelling fluid coming from your vagina.  You continue to feel sick to your stomach (nauseous), throw up (vomit), or have watery poop (diarrhea).  You have a nagging pain in your belly area.  You feel dizzy. Get help right away if:  You have a fever.  You are leaking fluid from your vagina.  You have spotting or bleeding from your vagina.  You have severe belly cramping or pain.  You lose or gain weight rapidly.  You have trouble catching your breath and have chest pain.  You notice sudden or extreme puffiness (swelling) of your face, hands, ankles, feet, or legs.  You have not felt the baby move in over an hour.  You have severe headaches that do not go away when you take medicine.  You have trouble seeing. Summary  The second trimester is from week 14 through week 27 (months 4 through 6). This is often the time in pregnancy that you feel your best.  To take care of yourself and   your unborn baby, you will need to eat healthy meals, take medicines only if your doctor tells you to do so, and do activities that are safe for you and your baby.  Call your doctor if you get sick or if you notice anything unusual about your pregnancy. Also, call your doctor if you need help with the right food to eat, or if you want to know what activities are safe for you. This information is not intended to replace advice given to you by your health care provider. Make sure you discuss any questions you have with your health care provider. Document Revised: 12/23/2018 Document Reviewed: 10/06/2016 Elsevier Patient Education  2020 Elsevier Inc.  

## 2020-06-04 NOTE — Progress Notes (Signed)
   OBSTETRICS PRENATAL VIRTUAL VISIT ENCOUNTER NOTE  Provider location: [facility] [place]   I connected with Brittany Welch on 06/04/20 at  2:40 PM EDT by MyChart Video Encounter at home and verified that I am speaking with the correct person using two identifiers.   I discussed the limitations, risks, security and privacy concerns of performing an evaluation and management service virtually and the availability of in person appointments. I also discussed with the patient that there may be a patient responsible charge related to this service. The patient expressed understanding and agreed to proceed. Subjective:  Brittany Welch is a 34 y.o. G3P2002 at [redacted]w[redacted]d being seen today for ongoing prenatal care.  She is currently monitored for the following issues for this low-risk pregnancy and has Rubella non-immune status, antepartum and Supervision of other normal pregnancy, antepartum on their problem list.  Patient reports no complaints.  Contractions: Not present.  .  Movement: Absent. Denies any leaking of fluid.   The following portions of the patient's history were reviewed and updated as appropriate: allergies, current medications, past family history, past medical history, past social history, past surgical history and problem list.   Objective:   Vitals:   06/04/20 1347  BP: (!) 91/58  Pulse: 62  Weight: 134 lb (60.8 kg)    Fetal Status:     Movement: Absent     General:  Alert, oriented and cooperative. Patient is in no acute distress.  Respiratory: Normal respiratory effort, no problems with respiration noted  Mental Status: Normal mood and affect. Normal behavior. Normal judgment and thought content.  Rest of physical exam deferred due to type of encounter  Imaging: No results found.  Assessment and Plan:  Pregnancy: G3P2002 at [redacted]w[redacted]d 1. [redacted] weeks gestation of pregnancy  2. Supervision of other normal pregnancy, antepartum - Patient doing well,  no complaints  - Routine prenatal care - Anticipatory guidance on upcoming appointments with next visit in person  - [place] appointment in 2 weeks for anatomy  - Patient denies feeling fetal movement at this time, reports usually occurs in past pregnancies around 19 weeks. Educated and discussed fetal movement and what to expect  - Safe medications during pregnancy discussed   3. Rubella non-immune status, antepartum - MMR PP   Preterm labor symptoms and general obstetric precautions including but not limited to vaginal bleeding, contractions, leaking of fluid and fetal movement were reviewed in detail with the patient. I discussed the assessment and treatment plan with the patient. The patient was provided an opportunity to ask questions and all were answered. The patient agreed with the plan and demonstrated an understanding of the instructions. The patient was advised to call back or seek an in-person office evaluation/go to MAU at [facility] for any urgent or concerning symptoms. Please refer to After Visit Summary for other counseling recommendations.   I provided 11 minutes of face-to-face time during this encounter.  Return in about 4 weeks (around 07/02/2020) for [place]- in person .  Future Appointments  Date Time Provider [place]  06/18/2020 12:45 PM [facility] US[facility]  07/02/2020 11:10 AM Lajean Manes, CNM [facility]    Lajean Manes, [place] [facility] [employer], [place]

## 2020-06-18 ENCOUNTER — Ambulatory Visit: Payer: 59 | Attending: Certified Nurse Midwife

## 2020-06-18 ENCOUNTER — Other Ambulatory Visit: Payer: Self-pay

## 2020-06-18 DIAGNOSIS — Z3480 Encounter for supervision of other normal pregnancy, unspecified trimester: Secondary | ICD-10-CM | POA: Diagnosis present

## 2020-07-02 ENCOUNTER — Ambulatory Visit (INDEPENDENT_AMBULATORY_CARE_PROVIDER_SITE_OTHER): Payer: 59 | Admitting: Certified Nurse Midwife

## 2020-07-02 ENCOUNTER — Encounter: Payer: 59 | Admitting: Certified Nurse Midwife

## 2020-07-02 ENCOUNTER — Encounter: Payer: Self-pay | Admitting: Certified Nurse Midwife

## 2020-07-02 ENCOUNTER — Other Ambulatory Visit: Payer: Self-pay

## 2020-07-02 VITALS — BP 92/51 | HR 64 | Wt 138.0 lb

## 2020-07-02 DIAGNOSIS — M549 Dorsalgia, unspecified: Secondary | ICD-10-CM

## 2020-07-02 DIAGNOSIS — O99891 Other specified diseases and conditions complicating pregnancy: Secondary | ICD-10-CM

## 2020-07-02 DIAGNOSIS — Z3480 Encounter for supervision of other normal pregnancy, unspecified trimester: Secondary | ICD-10-CM

## 2020-07-02 DIAGNOSIS — Z3A21 21 weeks gestation of pregnancy: Secondary | ICD-10-CM

## 2020-07-02 MED ORDER — COMFORT FIT MATERNITY SUPP LG MISC: 1.0000 [IU] | Freq: Every day | 0 refills | Status: DC

## 2020-07-02 NOTE — Progress Notes (Signed)
   PRENATAL VISIT NOTE  Subjective:  Brittany Welch is a 34 y.o. G3P2002 at [redacted]w[redacted]d being seen today for ongoing prenatal care.  She is currently monitored for the following issues for this low-risk pregnancy and has Rubella non-immune status, antepartum and Supervision of other normal pregnancy, antepartum on their problem list.  Patient reports no complaints.  Contractions: Not present. Vag. Bleeding: None.  Movement: Present. Denies leaking of fluid.   The following portions of the patient's history were reviewed and updated as appropriate: allergies, current medications, past family history, past medical history, past social history, past surgical history and problem list.   Objective:   Vitals:   07/02/20 1311  BP: (!) 92/51  Pulse: 64  Weight: 138 lb (62.6 kg)    Fetal Status: Fetal Heart Rate (bpm): 157 Fundal Height: 23 cm Movement: Present     General:  Alert, oriented and cooperative. Patient is in no acute distress.  Skin: Skin is warm and dry. No rash noted.   Cardiovascular: Normal heart rate noted  Respiratory: Normal respiratory effort, no problems with respiration noted  Abdomen: Soft, gravid, appropriate for gestational age.  Pain/Pressure: Absent     Pelvic: Cervical exam deferred        Extremities: Normal range of motion.  Edema: None  Mental Status: Normal mood and affect. Normal behavior. Normal judgment and thought content.   Assessment and Plan:  Pregnancy: G3P2002 at [redacted]w[redacted]d 1. [redacted] weeks gestation of pregnancy  2. Supervision of other normal pregnancy, antepartum - patient is doing well, no complaints - routine prenatal care - anticipatory guidance on upcoming appointments - reviewed anatomy US- normal and follow up as needed  - Patient is considering a home birth, encouraged to research providers for home births and look into birthing centers   3. Back pain affecting pregnancy in third trimester - Elastic Bandages & Supports (COMFORT FIT MATERNITY SUPP  LG) MISC; 1 Units by Does not apply route daily.  Dispense: 1 each; Refill: 0  Preterm labor symptoms and general obstetric precautions including but not limited to vaginal bleeding, contractions, leaking of fluid and fetal movement were reviewed in detail with the patient. Please refer to After Visit Summary for other counseling recommendations.   Return in about 4 weeks (around 07/30/2020) for ROB-mychart.  Future Appointments  Date Time Provider Department Center  07/30/2020  2:50 PM Sharyon Cable, CNM [facility]    Sharyon Cable, CNM

## 2020-07-02 NOTE — Patient Instructions (Addendum)
PREGNANCY SUPPORT BELT: You are not alone, Seventy-five percent of women have some sort of abdominal or back pain at some point in their pregnancy. Your baby is growing at a fast pace, which means that your whole body is rapidly trying to adjust to the changes. As your uterus grows, your back may start feeling a bit under stress and this can result in back or abdominal pain that can go from mild, and therefore bearable, to severe pains that will not allow you to sit or lay down comfortably, When it comes to dealing with pregnancy-related pains and cramps, some pregnant women usually prefer natural remedies, which the market is filled with nowadays. For example, wearing a pregnancy support belt can help ease and lessen your discomfort and pain. WHAT ARE THE BENEFITS OF WEARING A PREGNANCY SUPPORT BELT? A pregnancy support belt provides support to the lower portion of the belly taking some of the weight of the growing uterus and distributing to the other parts of your body. It is designed make you comfortable and gives you extra support. Over the years, the pregnancy apparel market has been studying the needs and wants of pregnant women and they have come up with the most comfortable pregnancy support belts that woman could ever ask for. In fact, you will no longer have to wear a stretched-out or bulky pregnancy belt that is visible underneath your clothes and makes you feel even more uncomfortable. Nowadays, a pregnancy support belt is made of comfortable and stretchy materials that will not irritate your skin but will actually make you feel at ease and you will not even notice you are wearing it. They are easy to put on and adjust during the day and can be worn at night for additional support.  BENEFITS: . Relives Back pain . Relieves Abdominal Muscle and Leg Pain . Stabilizes the Pelvic Ring . Offers a Cushioned Abdominal Lift Pad . Relieves pressure on the Sciatic Nerve Within Minutes WHERE TO GET  YOUR PREGNANCY BELT: [employer] [phone] @[id number]  [address] [place], [place]   Home Birth Home birth can be a safe option for many women to deliver their baby. However, certain medical conditions and other factors may increase the risk of serious problems for you and your baby. Talk with your health care provider about the benefits and risks of home birth to decide whether this is a good option for you. How does this affect me? If you choose to have a home birth, you may:  Have lower medical costs.  Be able to bond faster and more closely with your baby.  Be able to start breastfeeding sooner.  Have more freedom to move around during labor.  Be able to eat and drink as you wish throughout labor.  Not be exposed to hospital infections.  Not need an IV for fluids.  Be at lower risk of: ? Medical interventions. ? C-section (Cesarean delivery).  Feel more: ? Confidence in caring for your baby. ? At ease in a familiar setting. ? Comfortable and relaxed. This may help labor and delivery to progress more quickly and with fewer problems. How does this affect my baby? If your baby is delivered at home, he or she may:  Be able to bond immediately with you.  Have less chance of being separated from you after birth.  Start breastfeeding sooner.  Be less likely to have side effects from certain medicines given during labor. What can I do to lower  my risk? There are risks associated with delivering your baby at home. To lower your chance of any risks:  Hire a trained midwife to assist you with your home birth.  Create a birth plan that outlines your wishes.  Make sure you have access to a doctor who specializes in pregnancy, labor, and delivery (obstetrician).  Make sure you have a clearly defined emergency transfer plan to the hospital. Follow these instructions at home:  Make sure that your medical insurance covers a home birth.  Make sure that  you have a well-trained and experienced birthing team. This may include a midwife, a Tourist information centre manager, and a doula. Your birthing team should: ? Be able to recognize any complications that occur. ? Bring all instruments and medicines that are available to them according to the state law where you live. These should be used to treat you or your baby in the event of an emergency. ? Have a clearly defined plan of action, including transfer to a hospital, in the event of a complication that requires emergency care.  Make an appointment for your baby to be examined by a child specialist (pediatrician) within 24 hours of being born.  Be prepared for the possibility of having to go to the hospital to deliver your baby. Discuss all potential complications with your birthing team. When is a home birth not recommended? Your health care provider may not recommend a home birth if:  You have a high-risk pregnancy.  You have certain medical problems.  Your water broke earlier than expected (premature rupture of membranes), and more than 24 hours have passed before your baby is born.  There are problems with the fetus during pregnancy.  Your labor begins before 36 weeks or after 41 weeks.  You and your support person are not in agreement about whether to have a home birth.  You do not have a trained and experienced birthing team who can care for you at home during labor and delivery.  You live very far away from the nearest hospital.  You experienced certain complications during a previous labor and delivery. Questions to ask your health care provider  Am I a good candidate for home birth?  How many home births have you participated in?  What is your emergency plan if I need to be transferred to a hospital? Contact a health care provider if you:  Become very tired in labor.  Want pain medicine for your labor.  Develop a fever during labor.  Have labor that fails to progress.  Have  contractions that get weaker or that stop and do not return. These signs may indicate the need to be transferred to a hospital to deliver your baby. Get help right away if you:  Have certain complications with the labor or delivery progress, or with you or your baby. These may include: ? High blood pressure. ? Bleeding. ? The baby moving into a position that is not favorable for vaginal delivery. ? The umbilical cord entering the birth canal before the baby (umbilical cord prolapse). ? The baby's shoulders getting stuck on the mother's pubic bone after the head is delivered during labor (shoulder dystocia). These signs indicate that you should be transferred to a hospital to deliver your baby immediately. Summary  A home birth may be an option if both you and your baby are healthy, and you have a low risk pregnancy.  Seek the help of an experienced birthing team to assist you with your home birth.  Create a birth plan that outlines your wishes during your home birth.  Have an emergency plan to transfer to the hospital in case things do not go well. This information is not intended to replace advice given to you by your health care provider. Make sure you discuss any questions you have with your health care provider. Document Revised: 09/13/2017 Document Reviewed: 09/13/2017 Elsevier Patient Education  2020 [employer].

## 2020-07-30 ENCOUNTER — Telehealth (INDEPENDENT_AMBULATORY_CARE_PROVIDER_SITE_OTHER): Payer: 59 | Admitting: Certified Nurse Midwife

## 2020-07-30 ENCOUNTER — Encounter: Payer: Self-pay | Admitting: Certified Nurse Midwife

## 2020-07-30 VITALS — BP 104/46 | HR 69 | Wt 141.0 lb

## 2020-07-30 DIAGNOSIS — O09899 Supervision of other high risk pregnancies, unspecified trimester: Secondary | ICD-10-CM

## 2020-07-30 DIAGNOSIS — Z789 Other specified health status: Secondary | ICD-10-CM

## 2020-07-30 DIAGNOSIS — Z3480 Encounter for supervision of other normal pregnancy, unspecified trimester: Secondary | ICD-10-CM

## 2020-07-30 DIAGNOSIS — O99891 Other specified diseases and conditions complicating pregnancy: Secondary | ICD-10-CM

## 2020-07-30 DIAGNOSIS — Z2839 Other underimmunization status: Secondary | ICD-10-CM

## 2020-07-30 DIAGNOSIS — Z3A25 25 weeks gestation of pregnancy: Secondary | ICD-10-CM

## 2020-07-30 NOTE — Patient Instructions (Signed)
Glucose Tolerance Test During Pregnancy Why am I having this test? The glucose tolerance test (GTT) is done to check how your body processes sugar (glucose). This is one of several tests used to diagnose diabetes that develops during pregnancy (gestational diabetes mellitus). Gestational diabetes is a temporary form of diabetes that some women develop during pregnancy. It usually occurs during the second trimester of pregnancy and goes away after delivery. Testing (screening) for gestational diabetes usually occurs between 24 and 28 weeks of pregnancy. You may have the GTT test after having a 1-hour glucose screening test if the results from that test indicate that you may have gestational diabetes. You may also have this test if:  You have a history of gestational diabetes.  You have a history of giving birth to very large babies or have experienced repeated fetal loss (stillbirth).  You have signs and symptoms of diabetes, such as: ? Changes in your vision. ? Tingling or numbness in your hands or feet. ? Changes in hunger, thirst, and urination that are not otherwise explained by your pregnancy. What is being tested? This test measures the amount of glucose in your blood at different times during a period of 3 hours. This indicates how well your body is able to process glucose. What kind of sample is taken?  Blood samples are required for this test. They are usually collected by inserting a needle into a blood vessel. How do I prepare for this test?  For 3 days before your test, eat normally. Have plenty of carbohydrate-rich foods.  Follow instructions from your health care provider about: ? Eating or drinking restrictions on the day of the test. You may be asked to not eat or drink anything other than water (fast) starting 8-10 hours before the test. ? Changing or stopping your regular medicines. Some medicines may interfere with this test. Tell a health care provider about:  All  medicines you are taking, including vitamins, herbs, eye drops, creams, and over-the-counter medicines.  Any blood disorders you have.  Any surgeries you have had.  Any medical conditions you have. What happens during the test? First, your blood glucose will be measured. This is referred to as your fasting blood glucose, since you fasted before the test. Then, you will drink a glucose solution that contains a certain amount of glucose. Your blood glucose will be measured again 1, 2, and 3 hours after drinking the solution. This test takes about 3 hours to complete. You will need to stay at the testing location during this time. During the testing period:  Do not eat or drink anything other than the glucose solution.  Do not exercise.  Do not use any products that contain nicotine or tobacco, such as cigarettes and e-cigarettes. If you need help stopping, ask your health care provider. The testing procedure may vary among health care providers and hospitals. How are the results reported? Your results will be reported as milligrams of glucose per deciliter of blood (mg/dL) or millimoles per liter (mmol/L). Your health care provider will compare your results to normal ranges that were established after testing a large group of people (reference ranges). Reference ranges may vary among labs and hospitals. For this test, common reference ranges are:  Fasting: less than 95-105 mg/dL (5.3-5.8 mmol/L).  1 hour after drinking glucose: less than 180-190 mg/dL (10.0-10.5 mmol/L).  2 hours after drinking glucose: less than 155-165 mg/dL (8.6-9.2 mmol/L).  3 hours after drinking glucose: 140-145 mg/dL (7.8-8.1 mmol/L). What do the   results mean? Results within reference ranges are considered normal, meaning that your glucose levels are well-controlled. If two or more of your blood glucose levels are high, you may be diagnosed with gestational diabetes. If only one level is high, your health care  provider may suggest repeat testing or other tests to confirm a diagnosis. Talk with your health care provider about what your results mean. Questions to ask your health care provider Ask your health care provider, or the department that is doing the test:  When will my results be ready?  How will I get my results?  What are my treatment options?  What other tests do I need?  What are my next steps? Summary  The glucose tolerance test (GTT) is one of several tests used to diagnose diabetes that develops during pregnancy (gestational diabetes mellitus). Gestational diabetes is a temporary form of diabetes that some women develop during pregnancy.  You may have the GTT test after having a 1-hour glucose screening test if the results from that test indicate that you may have gestational diabetes. You may also have this test if you have any symptoms or risk factors for gestational diabetes.  Talk with your health care provider about what your results mean. This information is not intended to replace advice given to you by your health care provider. Make sure you discuss any questions you have with your health care provider. Document Revised: 12/22/2018 Document Reviewed: 04/12/2017 Elsevier Patient Education  2020 [employer] Inc[employer]  

## 2020-07-30 NOTE — Progress Notes (Signed)
   OBSTETRICS PRENATAL VIRTUAL VISIT ENCOUNTER NOTE  Provider location: [facility] [place]   I connected with Brittany Welch on 07/30/20 at  2:50 PM EST by MyChart Video Encounter at home and verified that I am speaking with the correct person using two identifiers.   I discussed the limitations, risks, security and privacy concerns of performing an evaluation and management service virtually and the availability of in person appointments. I also discussed with the patient that there may be a patient responsible charge related to this service. The patient expressed understanding and agreed to proceed. Subjective:  Brittany Welch is a 34 y.o. G3P2002 at [redacted]w[redacted]d being seen today for ongoing prenatal care.  She is currently monitored for the following issues for this low-risk pregnancy and has Rubella non-immune status, antepartum and Supervision of other normal pregnancy, antepartum on their problem list.  Patient reports no complaints.  Contractions: Not present. Vag. Bleeding: None.  Movement: Present. Denies any leaking of fluid.   The following portions of the patient's history were reviewed and updated as appropriate: allergies, current medications, past family history, past medical history, past social history, past surgical history and problem list.   Objective:   Vitals:   07/30/20 1401  BP: (!) 104/46  Pulse: 69  Weight: 141 lb (64 kg)    Fetal Status:     Movement: Present     General:  Alert, oriented and cooperative. Patient is in no acute distress.  Respiratory: Normal respiratory effort, no problems with respiration noted  Mental Status: Normal mood and affect. Normal behavior. Normal judgment and thought content.  Rest of physical exam deferred due to type of encounter  Imaging: No results found.  Assessment and Plan:  Pregnancy: G3P2002 at [redacted]w[redacted]d 1. Supervision of other normal pregnancy, antepartum - Patient doing well, no complaints -  routine prenatal care - anticipatory guidance on upcoming appointments with next being GTT  - Patients reports being sick all day after 2hr GTT, reports doing 1 hr GTT with 1st pregnancy without any adverse effects. Discussed with patient that we can do 1 hr GTT and she can eat small meal prior to GTT, patient verbalizes understanding and agrees to plan of care   2. Rubella non-immune status, antepartum - MMR PP   3. [redacted] weeks gestation of pregnancy  Preterm labor symptoms and general obstetric precautions including but not limited to vaginal bleeding, contractions, leaking of fluid and fetal movement were reviewed in detail with the patient. I discussed the assessment and treatment plan with the patient. The patient was provided an opportunity to ask questions and all were answered. The patient agreed with the plan and demonstrated an understanding of the instructions. The patient was advised to call back or seek an in-person office evaluation/go to [facility] at [facility] for any urgent or concerning symptoms. Please refer to After Visit Summary for other counseling recommendations.   I provided 15 minutes of face-to-face time during this encounter.  Return in about 3 weeks (around 08/20/2020) for LROB, GTT, in person.   Lajean Manes, [place] for [employer], [place]

## 2020-08-20 ENCOUNTER — Other Ambulatory Visit: Payer: Self-pay

## 2020-08-20 ENCOUNTER — Ambulatory Visit (INDEPENDENT_AMBULATORY_CARE_PROVIDER_SITE_OTHER): Payer: 59 | Admitting: Advanced Practice Midwife

## 2020-08-20 VITALS — BP 124/73 | HR 86 | Wt 146.0 lb

## 2020-08-20 DIAGNOSIS — Z3A28 28 weeks gestation of pregnancy: Secondary | ICD-10-CM | POA: Diagnosis not present

## 2020-08-20 DIAGNOSIS — Z3480 Encounter for supervision of other normal pregnancy, unspecified trimester: Secondary | ICD-10-CM

## 2020-08-20 NOTE — Progress Notes (Signed)
   PRENATAL VISIT NOTE  Subjective:  Brittany Welch is a 34 y.o. G3P2002 at [redacted]w[redacted]d being seen today for ongoing prenatal care.  She is currently monitored for the following issues for this low-risk pregnancy and has Rubella non-immune status, antepartum and Supervision of other normal pregnancy, antepartum on their problem list.  Patient reports occasional contractions.  Contractions: Not present. Vag. Bleeding: None.  Movement: Present. Denies leaking of fluid.   The following portions of the patient's history were reviewed and updated as appropriate: allergies, current medications, past family history, past medical history, past social history, past surgical history and problem list.   Objective:   Vitals:   08/20/20 1019  BP: 124/73  Pulse: 86  Weight: 146 lb (66.2 kg)    Fetal Status: Fetal Heart Rate (bpm): 152   Movement: Present     General:  Alert, oriented and cooperative. Patient is in no acute distress.  Skin: Skin is warm and dry. No rash noted.   Cardiovascular: Normal heart rate noted  Respiratory: Normal respiratory effort, no problems with respiration noted  Abdomen: Soft, gravid, appropriate for gestational age.  Pain/Pressure: Absent     Pelvic: Cervical exam deferred        Extremities: Normal range of motion.  Edema: None  Mental Status: Normal mood and affect. Normal behavior. Normal judgment and thought content.   Assessment and Plan:  Pregnancy: G3P2002 at [redacted]w[redacted]d  1. [redacted] weeks gestation of pregnancy - Glucose Tolerance, 1 HR (50g) - HIV antibody (with reflex) - CBC - RPR  2. Supervision of other normal pregnancy, antepartum --Anticipatory guidance about next visits/weeks of pregnancy given. --Pt to try a different pregnancy support belt, has one that does not fit well, will return it. --Next appt in 4 weeks, BRX opt schedule  Preterm labor symptoms and general obstetric precautions including but not limited to vaginal bleeding, contractions, leaking of  fluid and fetal movement were reviewed in detail with the patient. Please refer to After Visit Summary for other counseling recommendations.   No follow-ups on file.  No future appointments.  Sharen Counter, CNM

## 2020-08-20 NOTE — Patient Instructions (Signed)
Third Trimester of Pregnancy The third trimester is from week 28 through week 40 (months 7 through 9). The third trimester is a time when the unborn baby (fetus) is growing rapidly. At the end of the ninth month, the fetus is about 20 inches in length and weighs 6-10 pounds. Body changes during your third trimester Your body will continue to go through many changes during pregnancy. The changes vary from woman to woman. During the third trimester:  Your weight will continue to increase. You can expect to gain 25-35 pounds (11-16 kg) by the end of the pregnancy.  You may begin to get stretch marks on your hips, abdomen, and breasts.  You may urinate more often because the fetus is moving lower into your pelvis and pressing on your bladder.  You may develop or continue to have heartburn. This is caused by increased hormones that slow down muscles in the digestive tract.  You may develop or continue to have constipation because increased hormones slow digestion and cause the muscles that push waste through your intestines to relax.  You may develop hemorrhoids. These are swollen veins (varicose veins) in the rectum that can itch or be painful.  You may develop swollen, bulging veins (varicose veins) in your legs.  You may have increased body aches in the pelvis, back, or thighs. This is due to weight gain and increased hormones that are relaxing your joints.  You may have changes in your hair. These can include thickening of your hair, rapid growth, and changes in texture. Some women also have hair loss during or after pregnancy, or hair that feels dry or thin. Your hair will most likely return to normal after your baby is born.  Your breasts will continue to grow and they will continue to become tender. A yellow fluid (colostrum) may leak from your breasts. This is the first milk you are producing for your baby.  Your belly button may stick out.  You may notice more swelling in your hands,  face, or ankles.  You may have increased tingling or numbness in your hands, arms, and legs. The skin on your belly may also feel numb.  You may feel short of breath because of your expanding uterus.  You may have more problems sleeping. This can be caused by the size of your belly, increased need to urinate, and an increase in your body's metabolism.  You may notice the fetus "dropping," or moving lower in your abdomen (lightening).  You may have increased vaginal discharge.  You may notice your joints feel loose and you may have pain around your pelvic bone. What to expect at prenatal visits You will have prenatal exams every 2 weeks until week 36. Then you will have weekly prenatal exams. During a routine prenatal visit:  You will be weighed to make sure you and the baby are growing normally.  Your blood pressure will be taken.  Your abdomen will be measured to track your baby's growth.  The fetal heartbeat will be listened to.  Any test results from the previous visit will be discussed.  You may have a cervical check near your due date to see if your cervix has softened or thinned (effaced).  You will be tested for Group B streptococcus. This happens between 35 and 37 weeks. Your health care provider may ask you:  What your birth plan is.  How you are feeling.  If you are feeling the baby move.  If you have had any abnormal   symptoms, such as leaking fluid, bleeding, severe headaches, or abdominal cramping.  If you are using any tobacco products, including cigarettes, chewing tobacco, and electronic cigarettes.  If you have any questions. Other tests or screenings that may be performed during your third trimester include:  Blood tests that check for low iron levels (anemia).  Fetal testing to check the health, activity level, and growth of the fetus. Testing is done if you have certain medical conditions or if there are problems during the pregnancy.  Nonstress test  (NST). This test checks the health of your baby to make sure there are no signs of problems, such as the baby not getting enough oxygen. During this test, a belt is placed around your belly. The baby is made to move, and its heart rate is monitored during movement. What is false labor? False labor is a condition in which you feel small, irregular tightenings of the muscles in the womb (contractions) that usually go away with rest, changing position, or drinking water. These are called Braxton Hicks contractions. Contractions may last for hours, days, or even weeks before true labor sets in. If contractions come at regular intervals, become more frequent, increase in intensity, or become painful, you should see your health care provider. What are the signs of labor?  Abdominal cramps.  Regular contractions that start at 10 minutes apart and become stronger and more frequent with time.  Contractions that start on the top of the uterus and spread down to the lower abdomen and back.  Increased pelvic pressure and dull back pain.  A watery or bloody mucus discharge that comes from the vagina.  Leaking of amniotic fluid. This is also known as your "water breaking." It could be a slow trickle or a gush. Let your health care provider know if it has a color or strange odor. If you have any of these signs, call your health care provider right away, even if it is before your due date. Follow these instructions at home: Medicines  Follow your health care provider's instructions regarding medicine use. Specific medicines may be either safe or unsafe to take during pregnancy.  Take a prenatal vitamin that contains at least 600 micrograms (mcg) of folic acid.  If you develop constipation, try taking a stool softener if your health care provider approves. Eating and drinking   Eat a balanced diet that includes fresh fruits and vegetables, whole grains, good sources of protein such as meat, eggs, or tofu,  and low-fat dairy. Your health care provider will help you determine the amount of weight gain that is right for you.  Avoid raw meat and uncooked cheese. These carry germs that can cause birth defects in the baby.  If you have low calcium intake from food, talk to your health care provider about whether you should take a daily calcium supplement.  Eat four or five small meals rather than three large meals a day.  Limit foods that are high in fat and processed sugars, such as fried and sweet foods.  To prevent constipation: ? Drink enough fluid to keep your urine clear or pale yellow. ? Eat foods that are high in fiber, such as fresh fruits and vegetables, whole grains, and beans. Activity  Exercise only as directed by your health care provider. Most women can continue their usual exercise routine during pregnancy. Try to exercise for 30 minutes at least 5 days a week. Stop exercising if you experience uterine contractions.  Avoid heavy lifting.  Do   not exercise in extreme heat or humidity, or at high altitudes.  Wear low-heel, comfortable shoes.  Practice good posture.  You may continue to have sex unless your health care provider tells you otherwise. Relieving pain and discomfort  Take frequent breaks and rest with your legs elevated if you have leg cramps or low back pain.  Take warm sitz baths to soothe any pain or discomfort caused by hemorrhoids. Use hemorrhoid cream if your health care provider approves.  Wear a good support bra to prevent discomfort from breast tenderness.  If you develop varicose veins: ? Wear support pantyhose or compression stockings as told by your healthcare provider. ? Elevate your feet for 15 minutes, 3-4 times a day. Prenatal care  Write down your questions. Take them to your prenatal visits.  Keep all your prenatal visits as told by your health care provider. This is important. Safety  Wear your seat belt at all times when driving.  Make  a list of emergency phone numbers, including numbers for family, friends, the hospital, and police and fire departments. General instructions  Avoid cat litter boxes and soil used by cats. These carry germs that can cause birth defects in the baby. If you have a cat, ask someone to clean the litter box for you.  Do not travel far distances unless it is absolutely necessary and only with the approval of your health care provider.  Do not use hot tubs, steam rooms, or saunas.  Do not drink alcohol.  Do not use any products that contain nicotine or tobacco, such as cigarettes and e-cigarettes. If you need help quitting, ask your health care provider.  Do not use any medicinal herbs or unprescribed drugs. These chemicals affect the formation and growth of the baby.  Do not douche or use tampons or scented sanitary pads.  Do not cross your legs for long periods of time.  To prepare for the arrival of your baby: ? Take prenatal classes to understand, practice, and ask questions about labor and delivery. ? Make a trial run to the hospital. ? Visit the hospital and tour the maternity area. ? Arrange for maternity or paternity leave through employers. ? Arrange for family and friends to take care of pets while you are in the hospital. ? Purchase a rear-facing car seat and make sure you know how to install it in your car. ? Pack your hospital bag. ? Prepare the baby's nursery. Make sure to remove all pillows and stuffed animals from the baby's crib to prevent suffocation.  Visit your dentist if you have not gone during your pregnancy. Use a soft toothbrush to brush your teeth and be gentle when you floss. Contact a health care provider if:  You are unsure if you are in labor or if your water has broken.  You become dizzy.  You have mild pelvic cramps, pelvic pressure, or nagging pain in your abdominal area.  You have lower back pain.  You have persistent nausea, vomiting, or  diarrhea.  You have an unusual or bad smelling vaginal discharge.  You have pain when you urinate. Get help right away if:  Your water breaks before 37 weeks.  You have regular contractions less than 5 minutes apart before 37 weeks.  You have a fever.  You are leaking fluid from your vagina.  You have spotting or bleeding from your vagina.  You have severe abdominal pain or cramping.  You have rapid weight loss or weight gain.  You have   shortness of breath with chest pain.  You notice sudden or extreme swelling of your face, hands, ankles, feet, or legs.  Your baby makes fewer than 10 movements in 2 hours.  You have severe headaches that do not go away when you take medicine.  You have vision changes. Summary  The third trimester is from week 28 through week 40, months 7 through 9. The third trimester is a time when the unborn baby (fetus) is growing rapidly.  During the third trimester, your discomfort may increase as you and your baby continue to gain weight. You may have abdominal, leg, and back pain, sleeping problems, and an increased need to urinate.  During the third trimester your breasts will keep growing and they will continue to become tender. A yellow fluid (colostrum) may leak from your breasts. This is the first milk you are producing for your baby.  False labor is a condition in which you feel small, irregular tightenings of the muscles in the womb (contractions) that eventually go away. These are called Braxton Hicks contractions. Contractions may last for hours, days, or even weeks before true labor sets in.  Signs of labor can include: abdominal cramps; regular contractions that start at 10 minutes apart and become stronger and more frequent with time; watery or bloody mucus discharge that comes from the vagina; increased pelvic pressure and dull back pain; and leaking of amniotic fluid. This information is not intended to replace advice given to you by your  health care provider. Make sure you discuss any questions you have with your health care provider. Document Revised: 12/22/2018 Document Reviewed: 10/06/2016 Elsevier Patient Education  2020 [employer]  

## 2020-08-21 LAB — CBC
HCT: 31.9 % — ABNORMAL LOW (ref 35.0–45.0)
Hemoglobin: 11 g/dL — ABNORMAL LOW (ref 11.7–15.5)
MCH: 29.3 pg (ref 27.0–33.0)
MCHC: 34.5 g/dL (ref 32.0–36.0)
MCV: 84.8 fL (ref 80.0–100.0)
MPV: 11.6 fL (ref 7.5–12.5)
Platelets: 172 10*3/uL (ref 140–400)
RBC: 3.76 10*6/uL — ABNORMAL LOW (ref 3.80–5.10)
RDW: 12.8 % (ref 11.0–15.0)
WBC: 8.1 10*3/uL (ref 3.8–10.8)

## 2020-08-21 LAB — GLUCOSE TOLERANCE, 1 HOUR (50G) W/O FASTING: Glucose, 1 Hr, gestational: 93 mg/dL (ref 65–139)

## 2020-08-21 LAB — HIV ANTIBODY (ROUTINE TESTING W REFLEX): HIV 1&2 Ab, 4th Generation: NONREACTIVE

## 2020-08-21 LAB — RPR: RPR Ser Ql: NONREACTIVE

## 2020-09-17 ENCOUNTER — Other Ambulatory Visit: Payer: Self-pay

## 2020-09-17 ENCOUNTER — Ambulatory Visit (INDEPENDENT_AMBULATORY_CARE_PROVIDER_SITE_OTHER): Payer: 59 | Admitting: Advanced Practice Midwife

## 2020-09-17 VITALS — BP 108/55 | HR 79 | Wt 148.0 lb

## 2020-09-17 DIAGNOSIS — Z3483 Encounter for supervision of other normal pregnancy, third trimester: Secondary | ICD-10-CM

## 2020-09-17 DIAGNOSIS — Z3A32 32 weeks gestation of pregnancy: Secondary | ICD-10-CM

## 2020-09-17 DIAGNOSIS — Z3480 Encounter for supervision of other normal pregnancy, unspecified trimester: Secondary | ICD-10-CM

## 2020-09-17 NOTE — Patient Instructions (Signed)
Third Trimester of Pregnancy The third trimester is from week 28 through week 40 (months 7 through 9). The third trimester is a time when the unborn baby (fetus) is growing rapidly. At the end of the ninth month, the fetus is about 20 inches in length and weighs 6-10 pounds. Body changes during your third trimester Your body will continue to go through many changes during pregnancy. The changes vary from woman to woman. During the third trimester:  Your weight will continue to increase. You can expect to gain 25-35 pounds (11-16 kg) by the end of the pregnancy.  You may begin to get stretch marks on your hips, abdomen, and breasts.  You may urinate more often because the fetus is moving lower into your pelvis and pressing on your bladder.  You may develop or continue to have heartburn. This is caused by increased hormones that slow down muscles in the digestive tract.  You may develop or continue to have constipation because increased hormones slow digestion and cause the muscles that push waste through your intestines to relax.  You may develop hemorrhoids. These are swollen veins (varicose veins) in the rectum that can itch or be painful.  You may develop swollen, bulging veins (varicose veins) in your legs.  You may have increased body aches in the pelvis, back, or thighs. This is due to weight gain and increased hormones that are relaxing your joints.  You may have changes in your hair. These can include thickening of your hair, rapid growth, and changes in texture. Some women also have hair loss during or after pregnancy, or hair that feels dry or thin. Your hair will most likely return to normal after your baby is born.  Your breasts will continue to grow and they will continue to become tender. A yellow fluid (colostrum) may leak from your breasts. This is the first milk you are producing for your baby.  Your belly button may stick out.  You may notice more swelling in your hands,  face, or ankles.  You may have increased tingling or numbness in your hands, arms, and legs. The skin on your belly may also feel numb.  You may feel short of breath because of your expanding uterus.  You may have more problems sleeping. This can be caused by the size of your belly, increased need to urinate, and an increase in your body's metabolism.  You may notice the fetus "dropping," or moving lower in your abdomen (lightening).  You may have increased vaginal discharge.  You may notice your joints feel loose and you may have pain around your pelvic bone. What to expect at prenatal visits You will have prenatal exams every 2 weeks until week 36. Then you will have weekly prenatal exams. During a routine prenatal visit:  You will be weighed to make sure you and the baby are growing normally.  Your blood pressure will be taken.  Your abdomen will be measured to track your baby's growth.  The fetal heartbeat will be listened to.  Any test results from the previous visit will be discussed.  You may have a cervical check near your due date to see if your cervix has softened or thinned (effaced).  You will be tested for Group B streptococcus. This happens between 35 and 37 weeks. Your health care provider may ask you:  What your birth plan is.  How you are feeling.  If you are feeling the baby move.  If you have had any abnormal   symptoms, such as leaking fluid, bleeding, severe headaches, or abdominal cramping.  If you are using any tobacco products, including cigarettes, chewing tobacco, and electronic cigarettes.  If you have any questions. Other tests or screenings that may be performed during your third trimester include:  Blood tests that check for low iron levels (anemia).  Fetal testing to check the health, activity level, and growth of the fetus. Testing is done if you have certain medical conditions or if there are problems during the pregnancy.  Nonstress test  (NST). This test checks the health of your baby to make sure there are no signs of problems, such as the baby not getting enough oxygen. During this test, a belt is placed around your belly. The baby is made to move, and its heart rate is monitored during movement. What is false labor? False labor is a condition in which you feel small, irregular tightenings of the muscles in the womb (contractions) that usually go away with rest, changing position, or drinking water. These are called Braxton Hicks contractions. Contractions may last for hours, days, or even weeks before true labor sets in. If contractions come at regular intervals, become more frequent, increase in intensity, or become painful, you should see your health care provider. What are the signs of labor?  Abdominal cramps.  Regular contractions that start at 10 minutes apart and become stronger and more frequent with time.  Contractions that start on the top of the uterus and spread down to the lower abdomen and back.  Increased pelvic pressure and dull back pain.  A watery or bloody mucus discharge that comes from the vagina.  Leaking of amniotic fluid. This is also known as your "water breaking." It could be a slow trickle or a gush. Let your health care provider know if it has a color or strange odor. If you have any of these signs, call your health care provider right away, even if it is before your due date. Follow these instructions at home: Medicines  Follow your health care provider's instructions regarding medicine use. Specific medicines may be either safe or unsafe to take during pregnancy.  Take a prenatal vitamin that contains at least 600 micrograms (mcg) of folic acid.  If you develop constipation, try taking a stool softener if your health care provider approves. Eating and drinking   Eat a balanced diet that includes fresh fruits and vegetables, whole grains, good sources of protein such as meat, eggs, or tofu,  and low-fat dairy. Your health care provider will help you determine the amount of weight gain that is right for you.  Avoid raw meat and uncooked cheese. These carry germs that can cause birth defects in the baby.  If you have low calcium intake from food, talk to your health care provider about whether you should take a daily calcium supplement.  Eat four or five small meals rather than three large meals a day.  Limit foods that are high in fat and processed sugars, such as fried and sweet foods.  To prevent constipation: ? Drink enough fluid to keep your urine clear or pale yellow. ? Eat foods that are high in fiber, such as fresh fruits and vegetables, whole grains, and beans. Activity  Exercise only as directed by your health care provider. Most women can continue their usual exercise routine during pregnancy. Try to exercise for 30 minutes at least 5 days a week. Stop exercising if you experience uterine contractions.  Avoid heavy lifting.  Do   not exercise in extreme heat or humidity, or at high altitudes.  Wear low-heel, comfortable shoes.  Practice good posture.  You may continue to have sex unless your health care provider tells you otherwise. Relieving pain and discomfort  Take frequent breaks and rest with your legs elevated if you have leg cramps or low back pain.  Take warm sitz baths to soothe any pain or discomfort caused by hemorrhoids. Use hemorrhoid cream if your health care provider approves.  Wear a good support bra to prevent discomfort from breast tenderness.  If you develop varicose veins: ? Wear support pantyhose or compression stockings as told by your healthcare provider. ? Elevate your feet for 15 minutes, 3-4 times a day. Prenatal care  Write down your questions. Take them to your prenatal visits.  Keep all your prenatal visits as told by your health care provider. This is important. Safety  Wear your seat belt at all times when driving.  Make  a list of emergency phone numbers, including numbers for family, friends, the hospital, and police and fire departments. General instructions  Avoid cat litter boxes and soil used by cats. These carry germs that can cause birth defects in the baby. If you have a cat, ask someone to clean the litter box for you.  Do not travel far distances unless it is absolutely necessary and only with the approval of your health care provider.  Do not use hot tubs, steam rooms, or saunas.  Do not drink alcohol.  Do not use any products that contain nicotine or tobacco, such as cigarettes and e-cigarettes. If you need help quitting, ask your health care provider.  Do not use any medicinal herbs or unprescribed drugs. These chemicals affect the formation and growth of the baby.  Do not douche or use tampons or scented sanitary pads.  Do not cross your legs for long periods of time.  To prepare for the arrival of your baby: ? Take prenatal classes to understand, practice, and ask questions about labor and delivery. ? Make a trial run to the hospital. ? Visit the hospital and tour the maternity area. ? Arrange for maternity or paternity leave through employers. ? Arrange for family and friends to take care of pets while you are in the hospital. ? Purchase a rear-facing car seat and make sure you know how to install it in your car. ? Pack your hospital bag. ? Prepare the baby's nursery. Make sure to remove all pillows and stuffed animals from the baby's crib to prevent suffocation.  Visit your dentist if you have not gone during your pregnancy. Use a soft toothbrush to brush your teeth and be gentle when you floss. Contact a health care provider if:  You are unsure if you are in labor or if your water has broken.  You become dizzy.  You have mild pelvic cramps, pelvic pressure, or nagging pain in your abdominal area.  You have lower back pain.  You have persistent nausea, vomiting, or  diarrhea.  You have an unusual or bad smelling vaginal discharge.  You have pain when you urinate. Get help right away if:  Your water breaks before 37 weeks.  You have regular contractions less than 5 minutes apart before 37 weeks.  You have a fever.  You are leaking fluid from your vagina.  You have spotting or bleeding from your vagina.  You have severe abdominal pain or cramping.  You have rapid weight loss or weight gain.  You have   shortness of breath with chest pain.  You notice sudden or extreme swelling of your face, hands, ankles, feet, or legs.  Your baby makes fewer than 10 movements in 2 hours.  You have severe headaches that do not go away when you take medicine.  You have vision changes. Summary  The third trimester is from week 28 through week 40, months 7 through 9. The third trimester is a time when the unborn baby (fetus) is growing rapidly.  During the third trimester, your discomfort may increase as you and your baby continue to gain weight. You may have abdominal, leg, and back pain, sleeping problems, and an increased need to urinate.  During the third trimester your breasts will keep growing and they will continue to become tender. A yellow fluid (colostrum) may leak from your breasts. This is the first milk you are producing for your baby.  False labor is a condition in which you feel small, irregular tightenings of the muscles in the womb (contractions) that eventually go away. These are called Braxton Hicks contractions. Contractions may last for hours, days, or even weeks before true labor sets in.  Signs of labor can include: abdominal cramps; regular contractions that start at 10 minutes apart and become stronger and more frequent with time; watery or bloody mucus discharge that comes from the vagina; increased pelvic pressure and dull back pain; and leaking of amniotic fluid. This information is not intended to replace advice given to you by your  health care provider. Make sure you discuss any questions you have with your health care provider. Document Revised: 12/22/2018 Document Reviewed: 10/06/2016 Elsevier Patient Education  2020 [employer]  

## 2020-09-17 NOTE — Progress Notes (Signed)
   PRENATAL VISIT NOTE  Subjective:  Brittany Welch is a 35 y.o. G3P2002 at 37w3dbeing seen today for ongoing prenatal care.  She is currently monitored for the following issues for this low-risk pregnancy and has Rubella non-immune status, antepartum and Supervision of other normal pregnancy, antepartum on their problem list.  Patient reports no complaints.  Contractions: Not present. Vag. Bleeding: None.  Movement: Present. Denies leaking of fluid.   The following portions of the patient's history were reviewed and updated as appropriate: allergies, current medications, past family history, past medical history, past social history, past surgical history and problem list.   Objective:   Vitals:   09/17/20 1343  BP: (!) 108/55  Pulse: 79  Weight: 148 lb (67.1 kg)    Fetal Status: Fetal Heart Rate (bpm): 139   Movement: Present     General:  Alert, oriented and cooperative. Patient is in no acute distress.  Skin: Skin is warm and dry. No rash noted.   Cardiovascular: Normal heart rate noted  Respiratory: Normal respiratory effort, no problems with respiration noted  Abdomen: Soft, gravid, appropriate for gestational age.  Pain/Pressure: Absent     Pelvic: Cervical exam deferred        Extremities: Normal range of motion.  Edema: None  Mental Status: Normal mood and affect. Normal behavior. Normal judgment and thought content.   Assessment and Plan:  Pregnancy: G3P2002 at 339w3d. [redacted] weeks gestation of pregnancy  2. Supervision of other normal pregnancy, antepartum --Anticipatory guidance about next visits/weeks of pregnancy given. --Pt met with homebirth midwife today and is planning to transfer care --While homebirth is overall safe according to recent data, I did discuss homebirth in [place] which is complicated.  Legal issues makes non-nurse-midwives less prepared, less regulated, which reduces safety.  Pt to ask questions and may choose provider that makes her feel most  comfortable. --No follow up appointment was made today, pt to call as needed   Preterm labor symptoms and general obstetric precautions including but not limited to vaginal bleeding, contractions, leaking of fluid and fetal movement were reviewed in detail with the patient. Please refer to After Visit Summary for other counseling recommendations.   No follow-ups on file.  No future appointments.  LiFatima BlankCNM

## 2022-10-12 ENCOUNTER — Ambulatory Visit: Payer: 59 | Admitting: Obstetrics and Gynecology

## 2022-10-12 DIAGNOSIS — Z3480 Encounter for supervision of other normal pregnancy, unspecified trimester: Secondary | ICD-10-CM | POA: Insufficient documentation

## 2022-10-13 ENCOUNTER — Ambulatory Visit (INDEPENDENT_AMBULATORY_CARE_PROVIDER_SITE_OTHER): Payer: Medicaid Other

## 2022-10-13 VITALS — BP 117/60 | HR 71 | Wt 138.0 lb

## 2022-10-13 DIAGNOSIS — Z3A19 19 weeks gestation of pregnancy: Secondary | ICD-10-CM | POA: Diagnosis not present

## 2022-10-13 DIAGNOSIS — Z3482 Encounter for supervision of other normal pregnancy, second trimester: Secondary | ICD-10-CM

## 2022-10-13 DIAGNOSIS — Z3480 Encounter for supervision of other normal pregnancy, unspecified trimester: Secondary | ICD-10-CM

## 2022-10-13 NOTE — Progress Notes (Signed)
Erroneous encounter

## 2022-10-13 NOTE — Progress Notes (Signed)
Pt declined pap smear

## 2022-10-13 NOTE — Progress Notes (Signed)
Subjective:   ROLA LENNON is a 37 y.o. G5P3003 at [redacted]w[redacted]d by LMP being seen today for her first obstetrical visit.  Her obstetrical history is significant for 3 previous full term SVD's and has Rubella non-immune status, antepartum and Supervision of other normal pregnancy, antepartum on their problem list.. Patient does intend to breast feed. Pregnancy history fully reviewed.  Patient reports no complaints.  HISTORY: OB History  Gravida Para Term Preterm AB Living  5 3 3  0 1 3  SAB IAB Ectopic Multiple Live Births  1 0 0 0 3    # Outcome Date GA Lbr Len/2nd Weight Sex Delivery Anes PTL Lv  5 Current           4 Term 11/15/20 [redacted]w[redacted]d  8 lb 11 oz (3.941 kg)  Vag-Spont   LIV  3 Term 12/17/18 [redacted]w[redacted]d 03:09 / 00:09 8 lb 4.5 oz (3.755 kg) F Vag-Spont None  LIV     Birth Comments: none     Name: Fess,GIRL Constanza     Apgar1: 8  Apgar5: 9  2 Term 10/28/16 [redacted]w[redacted]d 05:21 / 03:41 9 lb 9.3 oz (4.345 kg) M Vag-Spont EPI, Local  LIV     Name: Creary,BOY Miamor     Apgar1: 7  Apgar5: 9  1 SAB            History reviewed. No pertinent past medical history. Past Surgical History:  Procedure Laterality Date   DENTAL SURGERY     NO PAST SURGERIES     Family History  Problem Relation Age of Onset   Diabetes Mother    Lung cancer Paternal Grandmother    Social History   Tobacco Use   Smoking status: Never   Smokeless tobacco: Never  Vaping Use   Vaping Use: Never used  Substance Use Topics   Alcohol use: No   Drug use: No   Allergies  Allergen Reactions   Latex Itching and Other (See Comments)    Itching and redness at site where exposed to latex    Current Outpatient Medications on File Prior to Visit  Medication Sig Dispense Refill   magnesium 30 MG tablet Take 30 mg by mouth 2 (two) times daily.     No current facility-administered medications on file prior to visit.   Indications for ASA therapy (per uptodate) One of the following: Previous pregnancy with preeclampsia,  especially early onset and with an adverse outcome No Multifetal gestation No Chronic hypertension No Type 1 or 2 diabetes mellitus No Chronic kidney disease No Autoimmune disease (antiphospholipid syndrome, systemic lupus erythematosus) No  Two or more of the following: Nulliparity No Obesity (body mass index >30 kg/m2) No Family history of preeclampsia in mother or sister No Age ?35 years No Sociodemographic characteristics (African American race, low socioeconomic level) No Personal risk factors (eg, previous pregnancy with low birth weight or small for gestational age infant, previous adverse pregnancy outcome [eg, stillbirth], interval >10 years between pregnancies) No  Indications for early 1 hour GTT (per uptodate)  BMI >25 (>23 in Asian women) AND one of the following  Gestational diabetes mellitus in a previous pregnancy No Glycated hemoglobin ?5.7 percent (39 mmol/mol), impaired glucose tolerance, or impaired fasting glucose on previous testing No First-degree relative with diabetes No High-risk race/ethnicity (eg, African American, Latino, Native American, [place] American, [place]er) No History of cardiovascular disease No Hypertension or on therapy for hypertension No High-density lipoprotein cholesterol level <35 mg/dL (0.90 mmol/L) and/or  a triglyceride level >250 mg/dL (2.82 mmol/L) No Polycystic ovary syndrome No Physical inactivity No Other clinical condition associated with insulin resistance (eg, severe obesity, acanthosis nigricans) No Previous birth of an infant weighing ?4000 g No Previous stillbirth of unknown cause No Exam   Vitals:   10/13/22 1049  BP: 117/60  Pulse: 71  Weight: 138 lb (62.6 kg)   Fetal Heart Rate (bpm): 148  Uterus:     Pelvic Exam: Perineum: declined   Vulva: declined   Vagina:  declined   Cervix: declined   Adnexa: declined   Bony Pelvis: declined  System: General: well-developed, well-nourished female in no acute  distress   Breast:  normal appearance, no masses or tenderness   Skin: normal coloration and turgor, no rashes   Neurologic: oriented, normal, negative, normal mood   Extremities: normal strength, tone, and muscle mass, ROM of all joints is normal   HEENT PERRLA, extraocular movement intact and sclera clear, anicteric   Mouth/Teeth mucous membranes moist, pharynx normal without lesions and dental hygiene good   Neck supple and no masses   Cardiovascular: regular rate and rhythm   Respiratory:  no respiratory distress, normal breath sounds   Abdomen: soft, non-tender; bowel sounds normal; no masses,  no organomegaly     Assessment:   Pregnancy: [id number] Patient Active Problem List   Diagnosis Date Noted   Supervision of other normal pregnancy, antepartum 10/12/2022   Rubella non-immune status, antepartum 10/16/2016     Plan:  1. Supervision of other normal pregnancy, antepartum - NOB. Patient doing well, no concerns - Patient is planning on home birth. She has had a previous home birth in the past. She has a home birth midwife - She declines pap. Has never had a pap. Reviewed that pap is a screening tool for cervical cancer that is recommended starting at the age of 19. She declines. - She declines genetic screening   - Pregnancy, Initial Screen - Culture, OB Urine - Hepatitis C Antibody - [name] Schedule Optimization - [place] MFM OB DETAIL +14 WK; Future  2. [redacted] weeks gestation of pregnancy - FH at umbilicus - Feeling fetal movement  Initial labs drawn. Continue prenatal vitamins. Discussed and offered genetic screening options, including Quad screen/AFP, NIPS testing, and option to decline testing. Benefits/risks/alternatives reviewed. Pt aware that anatomy US is form of genetic screening with lower accuracy in detecting trisomies than blood work.  Pt chooses/declines genetic screening today. NIPS: declined. Ultrasound discussed; fetal anatomic survey: ordered. Problem list  reviewed and updated. The nature of [facility] with multiple MDs and other Advanced Practice Providers was explained to patient; also emphasized that residents, students are part of our team. Routine obstetric precautions reviewed. Return in about 4 weeks (around 11/10/2022) for ROB.   Renee Harder, CNM 10/13/22 11:59 AM

## 2022-10-29 ENCOUNTER — Ambulatory Visit: Payer: Medicaid Other

## 2022-10-29 ENCOUNTER — Ambulatory Visit: Payer: Medicaid Other | Admitting: *Deleted

## 2022-10-29 ENCOUNTER — Encounter: Payer: Self-pay | Admitting: *Deleted

## 2022-10-29 VITALS — BP 108/63 | HR 85

## 2022-10-29 DIAGNOSIS — Z363 Encounter for antenatal screening for malformations: Secondary | ICD-10-CM | POA: Diagnosis not present

## 2022-10-29 DIAGNOSIS — Z3A21 21 weeks gestation of pregnancy: Secondary | ICD-10-CM | POA: Insufficient documentation

## 2022-10-29 DIAGNOSIS — Z3689 Encounter for other specified antenatal screening: Secondary | ICD-10-CM | POA: Diagnosis present

## 2022-10-29 DIAGNOSIS — Z3480 Encounter for supervision of other normal pregnancy, unspecified trimester: Secondary | ICD-10-CM | POA: Diagnosis not present

## 2022-10-29 DIAGNOSIS — O09522 Supervision of elderly multigravida, second trimester: Secondary | ICD-10-CM | POA: Insufficient documentation

## 2022-12-30 ENCOUNTER — Telehealth: Payer: Self-pay

## 2022-12-30 NOTE — Telephone Encounter (Signed)
Breastpump order signed and faxed.
